# Patient Record
Sex: Female | Born: 1935 | Race: White | Hispanic: No | Smoking: Former smoker
Health system: Southern US, Community
[De-identification: ages and names within clinical notes are randomized; demographics above are authoritative.]

## PROBLEM LIST (undated history)

## (undated) DIAGNOSIS — K219 Gastro-esophageal reflux disease without esophagitis: Secondary | ICD-10-CM

## (undated) DIAGNOSIS — I10 Essential (primary) hypertension: Secondary | ICD-10-CM

## (undated) DIAGNOSIS — K589 Irritable bowel syndrome without diarrhea: Secondary | ICD-10-CM

## (undated) DIAGNOSIS — G4762 Sleep related leg cramps: Secondary | ICD-10-CM

## (undated) DIAGNOSIS — F329 Major depressive disorder, single episode, unspecified: Secondary | ICD-10-CM

## (undated) DIAGNOSIS — M47812 Spondylosis without myelopathy or radiculopathy, cervical region: Secondary | ICD-10-CM

## (undated) DIAGNOSIS — D239 Other benign neoplasm of skin, unspecified: Secondary | ICD-10-CM

## (undated) DIAGNOSIS — H3530 Unspecified macular degeneration: Secondary | ICD-10-CM

## (undated) DIAGNOSIS — R42 Dizziness and giddiness: Secondary | ICD-10-CM

## (undated) DIAGNOSIS — E538 Deficiency of other specified B group vitamins: Secondary | ICD-10-CM

## (undated) DIAGNOSIS — E785 Hyperlipidemia, unspecified: Secondary | ICD-10-CM

## (undated) DIAGNOSIS — F32A Depression, unspecified: Secondary | ICD-10-CM

## (undated) DIAGNOSIS — N904 Leukoplakia of vulva: Secondary | ICD-10-CM

## (undated) DIAGNOSIS — Z85828 Personal history of other malignant neoplasm of skin: Secondary | ICD-10-CM

## (undated) HISTORY — PX: OTHER SURGICAL HISTORY: SHX169

## (undated) HISTORY — DX: Spondylosis without myelopathy or radiculopathy, cervical region: M47.812

## (undated) HISTORY — DX: Personal history of other malignant neoplasm of skin: Z85.828

## (undated) HISTORY — PX: CERVICAL SPINE SURGERY: SHX589

## (undated) HISTORY — PX: TONSILLECTOMY: SUR1361

## (undated) HISTORY — DX: Major depressive disorder, single episode, unspecified: F32.9

## (undated) HISTORY — DX: Sleep related leg cramps: G47.62

## (undated) HISTORY — DX: Irritable bowel syndrome, unspecified: K58.9

## (undated) HISTORY — PX: CATARACT EXTRACTION: SUR2

## (undated) HISTORY — DX: Dizziness and giddiness: R42

## (undated) HISTORY — DX: Gastro-esophageal reflux disease without esophagitis: K21.9

## (undated) HISTORY — DX: Hyperlipidemia, unspecified: E78.5

## (undated) HISTORY — DX: Deficiency of other specified B group vitamins: E53.8

## (undated) HISTORY — PX: APPENDECTOMY: SHX54

## (undated) HISTORY — DX: Unspecified macular degeneration: H35.30

## (undated) HISTORY — DX: Depression, unspecified: F32.A

## (undated) HISTORY — DX: Essential (primary) hypertension: I10

---

## 1998-05-10 ENCOUNTER — Other Ambulatory Visit: Admission: RE | Admit: 1998-05-10 | Discharge: 1998-05-10 | Payer: Self-pay | Admitting: *Deleted

## 1998-06-02 ENCOUNTER — Ambulatory Visit (HOSPITAL_COMMUNITY): Admission: RE | Admit: 1998-06-02 | Discharge: 1998-06-02 | Payer: Self-pay | Admitting: Gastroenterology

## 1999-09-05 ENCOUNTER — Other Ambulatory Visit: Admission: RE | Admit: 1999-09-05 | Discharge: 1999-09-05 | Payer: Self-pay | Admitting: *Deleted

## 1999-11-30 ENCOUNTER — Other Ambulatory Visit: Admission: RE | Admit: 1999-11-30 | Discharge: 1999-11-30 | Payer: Self-pay | Admitting: *Deleted

## 2000-10-17 ENCOUNTER — Encounter: Payer: Self-pay | Admitting: Surgery

## 2000-10-17 ENCOUNTER — Observation Stay (HOSPITAL_COMMUNITY): Admission: RE | Admit: 2000-10-17 | Discharge: 2000-10-18 | Payer: Self-pay | Admitting: Surgery

## 2000-10-17 ENCOUNTER — Encounter (INDEPENDENT_AMBULATORY_CARE_PROVIDER_SITE_OTHER): Payer: Self-pay | Admitting: Specialist

## 2000-11-26 ENCOUNTER — Other Ambulatory Visit: Admission: RE | Admit: 2000-11-26 | Discharge: 2000-11-26 | Payer: Self-pay | Admitting: *Deleted

## 2002-03-27 ENCOUNTER — Other Ambulatory Visit: Admission: RE | Admit: 2002-03-27 | Discharge: 2002-03-27 | Payer: Self-pay | Admitting: *Deleted

## 2003-10-23 ENCOUNTER — Other Ambulatory Visit: Admission: RE | Admit: 2003-10-23 | Discharge: 2003-10-23 | Payer: Self-pay | Admitting: *Deleted

## 2004-03-23 ENCOUNTER — Encounter: Admission: RE | Admit: 2004-03-23 | Discharge: 2004-03-23 | Payer: Self-pay | Admitting: Sports Medicine

## 2005-02-02 ENCOUNTER — Other Ambulatory Visit: Admission: RE | Admit: 2005-02-02 | Discharge: 2005-02-02 | Payer: Self-pay | Admitting: Obstetrics and Gynecology

## 2006-08-07 ENCOUNTER — Encounter: Admission: RE | Admit: 2006-08-07 | Discharge: 2006-08-30 | Payer: Self-pay | Admitting: Family Medicine

## 2009-05-06 ENCOUNTER — Encounter: Admission: RE | Admit: 2009-05-06 | Discharge: 2009-05-06 | Payer: Self-pay | Admitting: Family Medicine

## 2009-10-25 ENCOUNTER — Encounter: Admission: RE | Admit: 2009-10-25 | Discharge: 2009-11-24 | Payer: Self-pay | Admitting: Orthopedic Surgery

## 2010-03-30 ENCOUNTER — Other Ambulatory Visit: Payer: Self-pay | Admitting: Gastroenterology

## 2010-07-22 NOTE — Op Note (Signed)
[facility]  Patient:    Tiffany Mejia, Tiffany Mejia                     MRN: [id number] Proc. Date: 10/17/00 Adm. Date:  [id number] Attending:  Bonnetta Barry CC:         Doreatha Lew, M.D. of [facility]  Payton Doughty, M.D. at [facility]   Operative Report  PREOPERATIVE DIAGNOSIS:  Chronic cholecystitis, cholelithiasis.  POSTOPERATIVE DIAGNOSIS:  Chronic cholecystitis, cholelithiasis.  PROCEDURE:  Laparoscopic cholecystectomy with intraoperative cholangiography.  SURGEON:  Velora Heckler, M.D.  ASSISTANT:  Sheppard Plumber. Earlene Plater, M.D.  ANESTHESIA:  General per Dr. Almeta Monas.  ESTIMATED BLOOD LOSS:  Minimal.  PREPARATION:  Betadine.  COMPLICATIONS:  None.  INDICATIONS:  The patient is a 74 year old white female who presents at the request of Dr. Doreatha Lew from [facility] with symptomatic cholelithiasis.  The patient has had discrete episodes of biliary colic dating from February 5621.  These are associated with right upper quadrant abdominal pain radiating to the back associated with nausea.  An ultrasound performed August 14, 2000, at [facility] demonstrates numerous small gallstones.  The patient now comes to surgery for cholecystectomy.  DESCRIPTION OF PROCEDURE:  The procedure was done in OR #1 at the [facility].  The patient was brought to the operating room and placed in the supine position on the operating room table.  Following the administration of general anesthesia, the patient was prepped and draped in the usual strict aseptic fashion.  After ascertaining that an adequate level of anesthesia had been obtained, an infraumbilical incision was made with a #15 blade.  Dissection was carried down through the subcutaneous tissues.  The fascia was incised in the midline.  The peritoneal cavity was entered cautiously.  An 0 Vicryl pursestring suture was placed in  the fascia.  A Hasson cannula was introduced under direct vision and secured with a pursestring suture.  The abdomen was insufflated with carbon dioxide.  The laparoscope was introduced under direct vision and the abdomen explored.  There are adhesions in the right lower quadrant consistent with past history of appendectomy. There are omental adhesions to the gallbladder.  Operative ports are placed along the right costal margin in the midline, midclavicular line, and anterior axillary line.  Fundus of the gallbladder was grasped and retracted cephalad. Omental adhesions are dissected off the under surface of the gallbladder. Dissection was carried down to the neck of the gallbladder.  The peritoneum was incised at the neck of the gallbladder and the cystic duct is dissected out along its length.  A clip was placed at the neck of the gallbladder.  The cystic duct was incised with the scissors.  There was clear bile emanating from the opening.  A Cook cholangiography catheter was introduced through a right upper quadrant stab wound.  It was inserted without difficulty into the cystic duct and secured with a Ligaclip.  Using C-arm fluoroscopy, Real-Time cholangiography is performed.  There is rapid filling of the biliary tree.  There is reflux of contrast into the right and left ductal systems.  There is free flow of contrast into the duodenum without filling defect or evidence of obstruction.  The catheter was withdrawn and the clip was removed.  The cystic duct was then triply clipped and divided.  The cystic artery is dissected out along its length, doubly clipped, and divided.  The posterior branch of the cystic artery is  dissected out along its length, doubly clipped, and divided.  The gallbladder was excised from the gallbladder bed using the spatula electrocautery for hemostasis.  The gallbladder was completely excised.  I then extracted through the umbilical port.  It contains  multiple gallstones.  An 0 Vicryl pursestring suture is tied securely.  The right upper quadrant is copiously irrigated with warm saline which was evaluated.  Good hemostasis was noted.  Ports were removed under direct vision and pneumoperitoneum was released.  A local anesthetic is injected at the port sites.  All four wounds were closed with interrupted 4-0 Vicryl subcuticular sutures.  The wounds were washed and dried, and Benzoin and Steri-Strips are applied.  Sterile gauze dressings are applied.  The patient was awakened from anesthesia and brought to the recovery room in stable condition.  The patient tolerated the procedure well. DD:  10/17/00 TD:  10/17/00 Job: [id number]

## 2010-09-21 ENCOUNTER — Ambulatory Visit
Admission: RE | Admit: 2010-09-21 | Discharge: 2010-09-21 | Disposition: A | Discharge status: Home / Self Care | Payer: Medicare Other | Source: Ambulatory Visit | Attending: Family Medicine | Admitting: Family Medicine

## 2010-09-21 ENCOUNTER — Other Ambulatory Visit: Payer: Self-pay | Admitting: Family Medicine

## 2010-09-21 DIAGNOSIS — M549 Dorsalgia, unspecified: Secondary | ICD-10-CM

## 2011-08-28 ENCOUNTER — Ambulatory Visit: Payer: Medicare Other | Attending: Family Medicine | Admitting: Physical Therapy

## 2011-08-28 DIAGNOSIS — IMO0001 Reserved for inherently not codable concepts without codable children: Secondary | ICD-10-CM | POA: Insufficient documentation

## 2011-08-28 DIAGNOSIS — R269 Unspecified abnormalities of gait and mobility: Secondary | ICD-10-CM | POA: Insufficient documentation

## 2011-08-28 DIAGNOSIS — H8110 Benign paroxysmal vertigo, unspecified ear: Secondary | ICD-10-CM | POA: Insufficient documentation

## 2011-09-01 ENCOUNTER — Ambulatory Visit: Payer: Medicare Other | Admitting: Physical Therapy

## 2011-09-05 ENCOUNTER — Encounter: Payer: Medicare Other | Admitting: Rehabilitative and Restorative Service Providers"

## 2011-09-08 ENCOUNTER — Encounter: Payer: Medicare Other | Admitting: Rehabilitative and Restorative Service Providers"

## 2011-09-19 ENCOUNTER — Ambulatory Visit: Payer: Medicare Other | Attending: Family Medicine | Admitting: Physical Therapy

## 2011-09-19 DIAGNOSIS — R269 Unspecified abnormalities of gait and mobility: Secondary | ICD-10-CM | POA: Insufficient documentation

## 2011-09-19 DIAGNOSIS — H8110 Benign paroxysmal vertigo, unspecified ear: Secondary | ICD-10-CM | POA: Insufficient documentation

## 2011-09-19 DIAGNOSIS — IMO0001 Reserved for inherently not codable concepts without codable children: Secondary | ICD-10-CM | POA: Insufficient documentation

## 2012-01-31 ENCOUNTER — Ambulatory Visit: Payer: Medicare Other | Attending: Family Medicine | Admitting: *Deleted

## 2012-01-31 DIAGNOSIS — H8110 Benign paroxysmal vertigo, unspecified ear: Secondary | ICD-10-CM | POA: Insufficient documentation

## 2012-01-31 DIAGNOSIS — R269 Unspecified abnormalities of gait and mobility: Secondary | ICD-10-CM | POA: Insufficient documentation

## 2012-01-31 DIAGNOSIS — IMO0001 Reserved for inherently not codable concepts without codable children: Secondary | ICD-10-CM | POA: Insufficient documentation

## 2012-02-06 ENCOUNTER — Ambulatory Visit: Payer: Medicare Other | Attending: Family Medicine | Admitting: *Deleted

## 2012-02-06 DIAGNOSIS — H8110 Benign paroxysmal vertigo, unspecified ear: Secondary | ICD-10-CM | POA: Insufficient documentation

## 2012-02-06 DIAGNOSIS — R269 Unspecified abnormalities of gait and mobility: Secondary | ICD-10-CM | POA: Insufficient documentation

## 2012-02-06 DIAGNOSIS — IMO0001 Reserved for inherently not codable concepts without codable children: Secondary | ICD-10-CM | POA: Insufficient documentation

## 2012-02-09 ENCOUNTER — Ambulatory Visit: Payer: Medicare Other | Admitting: *Deleted

## 2012-02-13 ENCOUNTER — Ambulatory Visit: Payer: Medicare Other | Admitting: Physical Therapy

## 2012-02-15 ENCOUNTER — Ambulatory Visit: Payer: Medicare Other | Admitting: Physical Therapy

## 2012-02-20 ENCOUNTER — Ambulatory Visit: Payer: Medicare Other | Admitting: *Deleted

## 2012-02-23 ENCOUNTER — Ambulatory Visit: Payer: Medicare Other | Admitting: *Deleted

## 2012-03-04 ENCOUNTER — Encounter: Payer: Medicare Other | Admitting: Physical Therapy

## 2012-03-07 ENCOUNTER — Encounter: Payer: Medicare Other | Admitting: Physical Therapy

## 2012-04-02 ENCOUNTER — Ambulatory Visit: Payer: Medicare Other | Attending: Physical Medicine and Rehabilitation

## 2012-04-02 DIAGNOSIS — M545 Low back pain, unspecified: Secondary | ICD-10-CM | POA: Insufficient documentation

## 2012-04-02 DIAGNOSIS — R5381 Other malaise: Secondary | ICD-10-CM | POA: Insufficient documentation

## 2012-04-02 DIAGNOSIS — IMO0001 Reserved for inherently not codable concepts without codable children: Secondary | ICD-10-CM | POA: Insufficient documentation

## 2012-04-09 ENCOUNTER — Ambulatory Visit: Payer: Medicare Other | Attending: Physical Medicine and Rehabilitation

## 2012-04-09 DIAGNOSIS — R5381 Other malaise: Secondary | ICD-10-CM | POA: Insufficient documentation

## 2012-04-09 DIAGNOSIS — M545 Low back pain, unspecified: Secondary | ICD-10-CM | POA: Insufficient documentation

## 2012-04-09 DIAGNOSIS — IMO0001 Reserved for inherently not codable concepts without codable children: Secondary | ICD-10-CM | POA: Insufficient documentation

## 2012-04-16 ENCOUNTER — Ambulatory Visit: Payer: Medicare Other

## 2012-04-23 ENCOUNTER — Ambulatory Visit: Payer: Medicare Other

## 2013-04-02 ENCOUNTER — Ambulatory Visit (INDEPENDENT_AMBULATORY_CARE_PROVIDER_SITE_OTHER): Payer: Medicare Other | Admitting: Neurology

## 2013-04-02 ENCOUNTER — Encounter (INDEPENDENT_AMBULATORY_CARE_PROVIDER_SITE_OTHER): Payer: Self-pay

## 2013-04-02 ENCOUNTER — Encounter: Payer: Self-pay | Admitting: Neurology

## 2013-04-02 VITALS — BP 126/67 | HR 72 | Ht 66.34 in | Wt 148.0 lb

## 2013-04-02 DIAGNOSIS — R209 Unspecified disturbances of skin sensation: Secondary | ICD-10-CM

## 2013-04-02 DIAGNOSIS — M79609 Pain in unspecified limb: Secondary | ICD-10-CM | POA: Insufficient documentation

## 2013-04-02 NOTE — Progress Notes (Addendum)
Reason for visit: Neck pain, leg pain  Tiffany Mejia is a 78 y.o. female  History of present illness:  Ms. Vilar is a 78 year old right-handed white female with a history of numbness in the feet. The patient was last seen through this office on 01/04/2011. The patient continues to have some numbness in both feet. Prior nerve conduction studies were unremarkable. The patient indicates that she has had some pain and cramping in the calf of the left leg. The patient has some numbness that goes up the left leg slightly. The patient had symptoms that have gone on for about one year. The patient also notes some right neck discomfort and occasional aching pains into the right triceps muscle. The patient has undergone some neuromuscular therapy with some improvement. The patient denies any weakness of the extremities. The patient will occasionally have some right hand numbness. The patient does have some weakness with grip involving the right hand. The patient denies any balance issues or falls. The patient has some chronic bladder incontinence. The patient has been seen previously by Dr. Nelva Bush. The patient comes back to the office today for an evaluation.  Past Medical History  Diagnosis Date  . Hypertension   . HLD (hyperlipidemia)   . Macular degeneration   . Vitamin B12 deficiency   . Depression   . IBS (irritable bowel syndrome)   . GERD (gastroesophageal reflux disease)   . Nocturnal leg cramps   . Vertigo   . History of basal cell carcinoma   . Cervical spondylosis without myelopathy     Past Surgical History  Procedure Laterality Date  . Cataract extraction Right   . Cervical spine surgery    . Gallbladder resection    . Tonsillectomy    . Appendectomy      Family History  Problem Relation Age of Onset  . Heart disease Father   . Stroke Sister   . Neuropathy Sister   . Hypertension Brother   . Stroke Brother     Social history:  reports that she has quit smoking. She  has never used smokeless tobacco. She reports that she drinks alcohol. She reports that she does not use illicit drugs.  Medications:  No current outpatient prescriptions on file prior to visit.   No current facility-administered medications on file prior to visit.      Allergies  Allergen Reactions  . Nexium [Esomeprazole Magnesium] Rash    ROS:  Out of a complete 14 system review of symptoms, the patient complains only of the following symptoms, and all other reviewed systems are negative.  Neck pain, neck stiffness, ringing in the left ear Visual loss, left eye Leg swelling Frequent waking, leg spasms Incontinence of bladder, frequency of urination  Blood pressure 126/67, pulse 72, height 5' 6.34" (1.685 m), weight 148 lb (67.132 kg).  Physical Exam  General: The patient is alert and cooperative at the time of the examination.  Eyes: Pupils are equal, round, and reactive to light. Discs are flat bilaterally.  Neck: The neck is supple, no carotid bruits are noted.  Respiratory: The respiratory examination is clear.  Cardiovascular: The cardiovascular examination reveals a regular rate and rhythm, no obvious murmurs or rubs are noted.  Neuromuscular: The patient lacks about 20 lateral rotation of the cervical spine bilaterally. The patient is able to flex the low back to about 90.  Skin: Extremities are without significant edema.  Neurologic Exam  Mental status: The patient is alert and oriented  x 3 at the time of the examination. The patient has apparent normal recent and remote memory, with an apparently normal attention span and concentration ability.  Cranial nerves: Facial symmetry is present. There is good sensation of the face to pinprick and soft touch bilaterally. The strength of the facial muscles and the muscles to head turning and shoulder shrug are normal bilaterally. Speech is well enunciated, no aphasia or dysarthria is noted. Extraocular movements are  full. Visual fields are full. The tongue is midline, and the patient has symmetric elevation of the soft palate. No obvious hearing deficits are noted.  Motor: The motor testing reveals 5 over 5 strength of all 4 extremities. Good symmetric motor tone is noted throughout.  Sensory: Sensory testing is intact to pinprick, soft touch, vibration sensation, and position sense on all 4 extremities, with exception that there is a decrease in position sense of the right foot, and a stocking pattern pinprick sensory deficit two thirds the way up the legs bilaterally. No evidence of extinction is noted.  Coordination: Cerebellar testing reveals good finger-nose-finger and heel-to-shin bilaterally.  Gait and station: Gait is normal. Tandem gait is normal. Romberg is negative. No drift is seen.  Reflexes: Deep tendon reflexes are symmetric and normal bilaterally. Toes are downgoing bilaterally.    Lumbar MRI 03/11/2004:  IMPRESSION:  1. Mild multifactorial canal stenosis at L4-5. There is edema in the right L-4 pedicle and transverse process with prominent bilateral facet degeneration.  2. Mild multifactorial canal stenosis at L3-4 and to a lesser degree at L2-3.  3. Small bilateral T2- hyperintense foci are seen in both kidneys, probably related to cysts, but incompletely characterized. Ultrasound followup may be helpful to further evaluate.    Assessment/Plan:  One. Left leg discomfort, spasms  2. Cervical spondylosis, right neck and shoulder discomfort  3. Bilateral foot numbness  The patient be set up for nerve conduction studies of both legs and the right arm. EMG evaluation will be done on the right arm and left leg. Depending upon the results of this evaluation, the patient may be sent for further blood work. The patient may require further therapy with epidural steroid injections of the neck or back.  Jill Alexanders MD 04/02/2013 9:41 PM  [facility] [address] [place], [place] [address]  Phone [phone] Fax [phone]

## 2013-04-11 ENCOUNTER — Ambulatory Visit (INDEPENDENT_AMBULATORY_CARE_PROVIDER_SITE_OTHER): Payer: Medicare Other | Admitting: Neurology

## 2013-04-11 ENCOUNTER — Encounter (INDEPENDENT_AMBULATORY_CARE_PROVIDER_SITE_OTHER): Payer: Self-pay | Admitting: Radiology

## 2013-04-11 DIAGNOSIS — R209 Unspecified disturbances of skin sensation: Secondary | ICD-10-CM

## 2013-04-11 DIAGNOSIS — G5600 Carpal tunnel syndrome, unspecified upper limb: Secondary | ICD-10-CM

## 2013-04-11 DIAGNOSIS — Z0289 Encounter for other administrative examinations: Secondary | ICD-10-CM

## 2013-04-11 DIAGNOSIS — M79609 Pain in unspecified limb: Secondary | ICD-10-CM

## 2013-04-11 DIAGNOSIS — G544 Lumbosacral root disorders, not elsewhere classified: Secondary | ICD-10-CM

## 2013-04-11 MED ORDER — DULOXETINE HCL 30 MG PO CPEP: ORAL_CAPSULE | ORAL | Status: DC

## 2013-04-11 NOTE — Procedures (Signed)
HISTORY:  Tiffany Mejia is a 78 year old patient with a history of paresthesias in the feet, and some cramps in the legs, left greater than right. The patient has some achy sensations in the triceps area on the right arm. The patient being evaluated for a possible peripheral neuropathy or a radiculopathy.  NERVE CONDUCTION STUDIES:  Nerve conduction studies were performed on the right upper extremity. The distal motor latency for the right median nerve was prolonged, with a normal motor amplitude. The distal motor latency and motor amplitudes for the right ulnar nerve was normal. The F wave latencies and nerve conduction velocities for the right median and ulnar nerves were normal. The sensory latencies for the right median and ulnar nerves were normal, with slight prolongation of the sensory latency for the right radial nerve.  Nerve conduction studies were performed on both lower extremities. The distal motor latencies and motor amplitudes for the peroneal and posterior tibial nerves were normal bilaterally. Slight slowing was seen for the left peroneal nerve, normal on the right peroneal nerve. The nerve conduction velocities for the posterior tibial nerves were normal bilaterally. The F wave latencies for the peroneal and posterior tibial nerves were prolonged bilaterally. The sensory latencies for the peroneal nerves were normal bilaterally.  EMG STUDIES:  EMG study was performed on the right upper extremity:  The first dorsal interosseous muscle reveals 2 to 4 K units with full recruitment. No fibrillations or positive waves were noted. The abductor pollicis brevis muscle reveals 2 to 4 K units with full recruitment. No fibrillations or positive waves were noted. The extensor indicis proprius muscle reveals 1 to 3 K units with full recruitment. No fibrillations or positive waves were noted. The pronator teres muscle reveals 2 to 3 K units with full recruitment. No fibrillations or  positive waves were noted. The biceps muscle reveals 1 to 2 K units with full recruitment. No fibrillations or positive waves were noted. The triceps muscle reveals 2 to 4 K units with full recruitment. No fibrillations or positive waves were noted. The anterior deltoid muscle reveals 2 to 3 K units with full recruitment. No fibrillations or positive waves were noted. The cervical paraspinal muscles were tested at 2 levels. No abnormalities of insertional activity were seen at either level tested. There was good relaxation.  EMG study was performed on the left lower extremity:  The tibialis anterior muscle reveals 2 to 5K motor units with decreased recruitment. No fibrillations or positive waves were seen. The peroneus tertius muscle reveals 2 to 4K motor units with decreased recruitment. No fibrillations or positive waves were seen. The medial gastrocnemius muscle reveals 1 to 3K motor units with full recruitment. No fibrillations or positive waves were seen. The vastus lateralis muscle reveals 2 to 4K motor units with full recruitment. No fibrillations or positive waves were seen. The iliopsoas muscle reveals 2 to 4K motor units with full recruitment. No fibrillations or positive waves were seen. The biceps femoris muscle (long head) reveals 2 to 5K motor units with mildly decreased recruitment. No fibrillations or positive waves were seen. The lumbosacral paraspinal muscles were tested at 3 levels, and revealed no abnormalities of insertional activity at all 3 levels tested. There was good relaxation.   IMPRESSION:  Nerve conduction studies done on the right arm and both lower extremities does not show clear evidence of a peripheral neuropathy. There appears to be a borderline right carpal tunnel syndrome. EMG evaluation of the left lower extremity shows  findings that are consistent with a mild, chronic, stable L5 radiculopathy. EMG evaluation of the right upper extremity was unremarkable,  without evidence of an overlying cervical radiculopathy.  Jill Alexanders MD 04/11/2013 2:00 PM  [facility] [address] [place],  [address]  Phone [phone] Fax [phone]

## 2013-04-11 NOTE — Progress Notes (Signed)
Tiffany Mejia is a 78 year old patient with a history of achy sensations in the triceps muscle on the right arm, and some cramping sensations in both legs, left greater than right. The patient has tingling sensations in both feet, and the patient comes in for EMG and nerve conduction study. Nerve conductions do not show clear evidence of a peripheral neuropathy. The patient is a borderline right carpal tunnel syndrome. EMG of the right arm is unremarkable, EMG of the left leg is notable for a mild chronic stable L5 radiculopathy.  The patient will be given a wrist splint for the right wrist to try for 2 months to see if this improves her symptoms. The patient will be given a trial on Cymbalta to see that this helps some of the paresthesias and muscle cramps in the legs. In the past, the patient has not tolerated gabapentin, and she is worried about weight gain on Lyrica.  The patient will followup in 3-4 months. A prescription was given for the wrist splint.

## 2014-06-17 ENCOUNTER — Ambulatory Visit: Payer: PPO | Attending: Ophthalmology | Admitting: Occupational Therapy

## 2014-06-17 DIAGNOSIS — H53453 Other localized visual field defect, bilateral: Secondary | ICD-10-CM | POA: Diagnosis present

## 2014-06-17 DIAGNOSIS — H53419 Scotoma involving central area, unspecified eye: Secondary | ICD-10-CM

## 2014-06-18 NOTE — Therapy (Signed)
[facility] [address] [place], [place], [address] Phone: [phone]   Fax:  [phone]  Occupational Therapy Evaluation  Patient Details  Name: Tiffany Mejia MRN: [id number] Date of Birth: 03-Dec-1935 Referring Provider:  Gerarda Fraction, MD  Encounter Date: 06/17/2014      OT End of Session - 06/17/14 1526    Visit Number 1   Number of Visits 4   Authorization Type [employer] MCR   OT Start Time 1411   OT Stop Time 1515   OT Time Calculation (min) 64 min   Activity Tolerance Patient tolerated treatment well   Behavior During Therapy [facility] for tasks assessed/performed      Past Medical History  Diagnosis Date  . Hypertension   . HLD (hyperlipidemia)   . Macular degeneration   . Vitamin B12 deficiency   . Depression   . IBS (irritable bowel syndrome)   . GERD (gastroesophageal reflux disease)   . Nocturnal leg cramps   . Vertigo   . History of basal cell carcinoma   . Cervical spondylosis without myelopathy     Past Surgical History  Procedure Laterality Date  . Cataract extraction Right   . Cervical spine surgery    . Gallbladder resection    . Tonsillectomy    . Appendectomy      There were no vitals filed for this visit.  Visit Diagnosis:  Scotoma involving central area, unspecified laterality      Subjective Assessment - 06/17/14 1416    Subjective  Pt reports she would like to be able to read easier   Pertinent History Pt with macular degeneration, history of cataract surgery with lens implant   Patient Stated Goals Pt reports she is interested in a better magnifier.   Currently in Pain? Yes   Pain Location Leg   Pain Orientation Right;Left   Pain Type Neuropathic pain   Pain Onset More than a month ago   Aggravating Factors  unknown   Pain Relieving Factors repositioning   Multiple Pain Sites No           [facility] OT Assessment - 06/18/14 0001    Assessment   Diagnosis macular  degeneration, cataract surgery s/p lens implant   Onset Date 05/14/14  MD referral   Assessment Pt presents with visual deficits and can benefit from skilled occupational therapy for education regarding adapted strategies/ equipment to compensate for these deficits.   Prior Therapy PT for balance   Precautions   Precautions Fall;Other (comment)  visual impairment   Home  Environment   Family/patient expects to be discharged to: Private residence   Home Layout Two level   Additional Comments Pt plans to move to a 1 level apartment   Lives With Alone   Prior Function   Level of Hand with basic ADLs;Independent with homemaking with ambulation   ADL   ADL comments modified independent with all basic ADLS  Pt drives familiar routes.   Mobility   Mobility Status Independent   Vision - History   Baseline Vision Other (comment)  wears glasses PRN   Visual History Cataracts   Additional Comments Pt wants to be able to read better.   Vision Assessment   Vision Assessment Vision tested  for reading acuity   Per MD/OD Report OD 20/60+2, CF at 1"   Reading Acuity 20/63   Patient has diffculty with activities due to visual impairment Adjusting stove/washing machine dials;Writing checks;Balancing checkbook;Reading bills  Education  performed re: use of hi marks check writing guide   Comment Pt reports increased difficulty using her magnifier now.Pt drives familiar routes  Pt has a CCTV that belonged to her husband.   Cognition   Mini Mental State Exam  29/30 WNLS                  OT Treatments/Exercises (OP) - 06/18/14 0001    Visual/Perceptual Exercises   Other Exercises Pt was educated regarding 3X stand and handheld magnifier use. She demonstrates ability to read continuous text.  Pt also demonstrated ability to read using CCTV                OT Education - 06/18/14 1035    Education provided Yes   Education Details Use of 3x stand,handheld  magnifiers, eccentric viewing, use of CCTV, and line guides for check writing   Person(s) Educated Patient   Methods Explanation;Demonstration;Verbal cues  catalog provided   Comprehension Verbalized understanding;Returned demonstration  Pt returned demonstration of magnifier and CCTV use. she verbalizes understanding of eccentric viewing and line guide use.             OT Long Term Goals - 06/18/14 1034    OT LONG TERM GOAL #1   Title No goals were set as pt was seen for eval and tx day of evaluation.               Plan - 06/18/14 1029    Clinical Impression Statement Pt with macular degeneration, and history of cataract surgery with lens implant presents with low vision. Pt can benefit from skilled OT for evaluation and edcation regarding adpted strategies for low vision.   Pt will benefit from skilled therapeutic intervention in order to improve on the following deficits (Retired) Impaired vision/preception   Rehab Potential Good   Clinical Impairments Affecting Rehab Potential visual deficits   OT Frequency One time visit  evaluation and 1 tx   OT Treatment/Interventions Self-care/ADL training;DME and/or AE instruction;Patient/family education;Visual/perceptual remediation/compensation   Plan Pt was seen for evaluation and treatment day of evaluation. Pt demonstrates good understanding of magnifier use and does not need additional visits.   OT Home Exercise Plan Pt was educated regarding use of 3x stand and hand held magnifiers, CCTV, check line guides and eccentric viewing   Recommended Other Services Pt was provided with an [employer] catalog for purchase of AE PRN (Pt was made aware that [facility] is not affliliated with [facility])   Consulted and Agree with Plan of Care Patient          G-Codes - Jul 05, 2014 1600    Functional Assessment Tool Used clinical observations   Functional Limitation Self care   Self Care Current Status (Y7062)  At least 1 percent but less than 20 percent impaired, limited or restricted   Self Care Goal Status (B7628) At least 1 percent but less than 20 percent impaired, limited or restricted   Self Care Discharge Status [id number]) At least 1 percent but less than 20 percent impaired, limited or restricted      Problem List Patient Active Problem List   Diagnosis Date Noted  . Disturbance of skin sensation 04/02/2013  . Pain in limb 04/02/2013    Saniyyah Elster 06/18/2014, 10:56 AM Theone Murdoch, OTR/L [phone] Phone: [phone] 10:56 AM 04/14/2016[facility] [address] [place], [place], [address] Phone: [phone]   Fax:  [phone]

## 2014-07-23 ENCOUNTER — Other Ambulatory Visit: Payer: Self-pay | Admitting: Obstetrics & Gynecology

## 2014-07-23 DIAGNOSIS — I8390 Asymptomatic varicose veins of unspecified lower extremity: Secondary | ICD-10-CM

## 2014-07-30 ENCOUNTER — Ambulatory Visit
Admission: RE | Admit: 2014-07-30 | Discharge: 2014-07-30 | Disposition: A | Discharge status: Home / Self Care | Payer: PPO | Source: Ambulatory Visit | Attending: Obstetrics & Gynecology | Admitting: Obstetrics & Gynecology

## 2014-07-30 DIAGNOSIS — I8390 Asymptomatic varicose veins of unspecified lower extremity: Secondary | ICD-10-CM

## 2014-07-30 HISTORY — DX: Other benign neoplasm of skin, unspecified: D23.9

## 2014-07-30 HISTORY — DX: Leukoplakia of vulva: N90.4

## 2014-07-30 NOTE — Consult Note (Signed)
Chief Complaint: Chief Complaint  Patient presents with  . Advice Only    ? vulva cyst    Referring Physician(s): Ozan,Jennifer  History of Present Illness: Tiffany Mejia is a 79 y.o. female complaining of a painful nodule on her left vulvar region. The lesion is irritated by overlying clothing. Previous dermatologic assessment had reportedly diagnosed as a cyst. No pelvic pain, pelvic pressure, bloating, varicose veins.  Past Medical History  Diagnosis Date  . Hypertension   . HLD (hyperlipidemia)   . Macular degeneration   . Vitamin B12 deficiency   . Depression   . IBS (irritable bowel syndrome)   . GERD (gastroesophageal reflux disease)   . Nocturnal leg cramps   . Vertigo   . History of basal cell carcinoma   . Cervical spondylosis without myelopathy   . Lichen sclerosus of female genitalia   . Angiokeratoma     Past Surgical History  Procedure Laterality Date  . Cataract extraction Right   . Cervical spine surgery    . Gallbladder resection    . Tonsillectomy    . Appendectomy      Allergies: Nexium  Medications: Prior to Admission medications   Medication Sig Start Date End Date Taking? Authorizing Provider  clobetasol ointment (TEMOVATE) 5.63 % Apply 1 application topically 2 (two) times daily.   Yes Historical Provider, MD  lisinopril (PRINIVIL,ZESTRIL) 2.5 MG tablet Take 2.5 mg by mouth daily.   Yes Historical Provider, MD  Melatonin 3 MG CAPS Take 1 capsule by mouth.   Yes Historical Provider, MD  Multiple Vitamins-Minerals (VITEYES AREDS FORMULA/LUTEIN) CAPS Take 1 capsule by mouth daily.   Yes Historical Provider, MD  PREMARIN vaginal cream Place 8.756 Applicatorfuls vaginally at bedtime as needed. 03/21/13  Yes Historical Provider, MD  vitamin B-12 (CYANOCOBALAMIN) 1000 MCG tablet Take 1,000 mcg by mouth daily.   Yes Historical Provider, MD  Vitamin D, Ergocalciferol, (DRISDOL) 50000 UNITS CAPS capsule Take 50,000 Units by mouth every 7  (seven) days.   Yes Historical Provider, MD  zolpidem (AMBIEN) 10 MG tablet Take 10 mg by mouth at bedtime as needed for sleep (.5 as needed).   Yes Historical Provider, MD  DULoxetine (CYMBALTA) 30 MG capsule Take 30 mg by mouth daily.    Historical Provider, MD  DULoxetine (CYMBALTA) 30 MG capsule One tablet daily for 2 weeks, then take 1 tablet twice daily Patient not taking: Reported on 07/30/2014 04/11/13   Kathrynn Ducking, MD  pravastatin (PRAVACHOL) 20 MG tablet Take 20 mg by mouth daily. 03/28/13   Historical Provider, MD     Family History  Problem Relation Age of Onset  . Heart disease Father   . Stroke Sister   . Neuropathy Sister   . Hypertension Brother   . Stroke Brother     History   Social History  . Marital Status: Widowed    Spouse Name: N/A  . Number of Children: 5  . Years of Education: 12th   Occupational History  . retired    Social History Main Topics  . Smoking status: Former Research scientist (life sciences)  . Smokeless tobacco: Never Used     Comment: quit Feb 1997  . Alcohol Use: Yes     Comment: occassional 2 glasses of wine nightly  . Drug Use: No  . Sexual Activity: Not on file   Other Topics Concern  . Not on file   Social History Narrative    ECOG Status: 1 - Symptomatic but  completely ambulatory  Review of Systems: A 12 point ROS discussed and pertinent positives are indicated in the HPI above.  All other systems are negative.  Review of Systems  Vital Signs: BP 128/66 mmHg  Pulse 66  Temp(Src) 97.8 F (36.6 C) (Oral)  Resp 14  Ht 5\' 6"  (1.676 m)  Wt 153 lb (69.4 kg)  BMI 24.71 kg/m2  SpO2 97%  Physical Exam  Genitourinary:    Pelvic exam was performed with patient supine.  Firm cutaneous nodule which could not be reduced or compressed, nontender, no erythema. Small 1 mm black head at the inferior extent. No regional varicose veins or other skin discoloration or trophic changes    Mallampati Score:     Imaging: No results  found.  Labs:  CBC: No results for input(s): WBC, HGB, HCT, PLT in the last 8760 hours.  COAGS: No results for input(s): INR, APTT in the last 8760 hours.  BMP: No results for input(s): NA, K, CL, CO2, GLUCOSE, BUN, CALCIUM, CREATININE, GFRNONAA, GFRAA in the last 8760 hours.  Invalid input(s): CMP  LIVER FUNCTION TESTS: No results for input(s): BILITOT, AST, ALT, ALKPHOS, PROT, ALBUMIN in the last 8760 hours.  TUMOR MARKERS: No results for input(s): AFPTM, CEA, CA199, CHROMGRNA in the last 8760 hours.  Assessment and Plan:  The lesion is not characteristic of vascular abnormality, likely benign cutaneous lesion such as sebaceous cyst. I don't think ultrasound or CT venography of the pelvis to look for venographic evidence of pelvic congestion syndrome would be fruitful given the lack of any symptomatology. I discussed the findings with the patient. She'll follow-up with her dermatologist regarding treatment options.  Thank you for this interesting consult.  I greatly enjoyed meeting SENA HOOPINGARNER and look forward to participating in their care.  Signed: Shatoria Stooksbury III, DAYNE Juanitta Earnhardt 07/30/2014, 4:12 PM   I spent a total of   20 Minutes   in face to face in clinical consultation, greater than 50% of which was counseling/coordinating care for left vulvar lesion.

## 2014-08-31 ENCOUNTER — Other Ambulatory Visit: Payer: Self-pay

## 2015-03-09 DIAGNOSIS — H353133 Nonexudative age-related macular degeneration, bilateral, advanced atrophic without subfoveal involvement: Secondary | ICD-10-CM | POA: Diagnosis not present

## 2015-03-24 DIAGNOSIS — Z23 Encounter for immunization: Secondary | ICD-10-CM | POA: Diagnosis not present

## 2015-03-24 DIAGNOSIS — M545 Low back pain: Secondary | ICD-10-CM | POA: Diagnosis not present

## 2015-03-25 ENCOUNTER — Other Ambulatory Visit: Payer: Self-pay | Admitting: Family Medicine

## 2015-03-25 DIAGNOSIS — M48061 Spinal stenosis, lumbar region without neurogenic claudication: Secondary | ICD-10-CM

## 2015-03-30 ENCOUNTER — Ambulatory Visit
Admission: RE | Admit: 2015-03-30 | Discharge: 2015-03-30 | Disposition: A | Discharge status: Home / Self Care | Payer: Medicare HMO | Source: Ambulatory Visit | Attending: Family Medicine | Admitting: Family Medicine

## 2015-03-30 DIAGNOSIS — M48061 Spinal stenosis, lumbar region without neurogenic claudication: Secondary | ICD-10-CM

## 2015-03-30 DIAGNOSIS — M4806 Spinal stenosis, lumbar region: Secondary | ICD-10-CM | POA: Diagnosis not present

## 2015-04-30 DIAGNOSIS — E785 Hyperlipidemia, unspecified: Secondary | ICD-10-CM | POA: Diagnosis not present

## 2015-04-30 DIAGNOSIS — Z0000 Encounter for general adult medical examination without abnormal findings: Secondary | ICD-10-CM | POA: Diagnosis not present

## 2015-04-30 DIAGNOSIS — Z1389 Encounter for screening for other disorder: Secondary | ICD-10-CM | POA: Diagnosis not present

## 2015-04-30 DIAGNOSIS — R739 Hyperglycemia, unspecified: Secondary | ICD-10-CM | POA: Diagnosis not present

## 2015-05-10 DIAGNOSIS — M545 Low back pain: Secondary | ICD-10-CM | POA: Diagnosis not present

## 2015-05-10 DIAGNOSIS — M5416 Radiculopathy, lumbar region: Secondary | ICD-10-CM | POA: Diagnosis not present

## 2015-06-02 ENCOUNTER — Ambulatory Visit: Payer: Medicare HMO | Attending: Orthopedic Surgery

## 2015-06-02 DIAGNOSIS — R293 Abnormal posture: Secondary | ICD-10-CM | POA: Diagnosis not present

## 2015-06-02 DIAGNOSIS — L720 Epidermal cyst: Secondary | ICD-10-CM | POA: Diagnosis not present

## 2015-06-02 DIAGNOSIS — L821 Other seborrheic keratosis: Secondary | ICD-10-CM | POA: Diagnosis not present

## 2015-06-02 DIAGNOSIS — D485 Neoplasm of uncertain behavior of skin: Secondary | ICD-10-CM | POA: Diagnosis not present

## 2015-06-02 DIAGNOSIS — M545 Low back pain, unspecified: Secondary | ICD-10-CM

## 2015-06-02 DIAGNOSIS — R202 Paresthesia of skin: Secondary | ICD-10-CM | POA: Diagnosis not present

## 2015-06-02 DIAGNOSIS — D1801 Hemangioma of skin and subcutaneous tissue: Secondary | ICD-10-CM | POA: Diagnosis not present

## 2015-06-02 DIAGNOSIS — Z85828 Personal history of other malignant neoplasm of skin: Secondary | ICD-10-CM | POA: Diagnosis not present

## 2015-06-02 DIAGNOSIS — M25652 Stiffness of left hip, not elsewhere classified: Secondary | ICD-10-CM | POA: Insufficient documentation

## 2015-06-02 NOTE — Patient Instructions (Signed)
Perform all exercises below:  Hold _20___ seconds. Repeat _3___ times.  Do __3__ sessions per day. CAUTION: Movement should be gentle, steady and slow.  Knee to Chest  Lying supine, bend involved knee to chest. Perform with each leg.  Copyright  VHI. All rights reserved.  Double Knee to Chest (Flexion)   Gently pull both knees toward chest. Feel stretch in lower back or buttock area. Breathing deeply, Lumbar Rotation: Caudal - Bilateral (Supine)  Feet and knees together, arms outstretched, rotate knees left, turning head in opposite direction, until stretch is felt.      HIP: Hamstrings - Short Sitting   Hell on the floor with toes up. Keep knee straight. Lift chest. Lean forward until a stretch is felt.  [facility] [address] [place], [place] [address] Phone # [phone] Fax [phone]

## 2015-06-02 NOTE — Therapy (Signed)
[facility] [address] [place], [place], [address] Phone: [phone]   Fax:  [phone]  Physical Therapy Evaluation  Patient Details  Name: Tiffany Mejia MRN: [id number] Date of Birth: January 18, 1936 Referring Provider: Phylliss Bob, MD  Encounter Date: 06/02/2015      PT End of Session - 06/02/15 1507    Visit Number 1   Number of Visits 10   Date for PT Re-Evaluation 07/28/15   PT Start Time [id number]   PT Stop Time 1514   PT Time Calculation (min) 39 min   Activity Tolerance Patient tolerated treatment well   Behavior During Therapy [facility] for tasks assessed/performed      Past Medical History  Diagnosis Date  . Hypertension   . HLD (hyperlipidemia)   . Macular degeneration   . Vitamin B12 deficiency   . Depression   . IBS (irritable bowel syndrome)   . GERD (gastroesophageal reflux disease)   . Nocturnal leg cramps   . Vertigo   . History of basal cell carcinoma   . Cervical spondylosis without myelopathy   . Lichen sclerosus of female genitalia   . Angiokeratoma     Past Surgical History  Procedure Laterality Date  . Cataract extraction Right   . Cervical spine surgery    . Gallbladder resection    . Tonsillectomy    . Appendectomy      There were no vitals filed for this visit.  Visit Diagnosis:  Bilateral low back pain without sciatica - Plan: PT plan of care cert/re-cert  Stiffness of left hip, not elsewhere classified - Plan: PT plan of care cert/re-cert  Abnormal posture - Plan: PT plan of care cert/re-cert      Subjective Assessment - 06/02/15 1428    Subjective Pt presents to PT with chronic history of LBP and Lt LE radiculopathy.  Pt reports that her Lt foot is always numb.  Pt has been prescribed with Gabapentin to help with these symptoms and Meloxicam for inflammation.  MD would like pt to attend 1-3 sessions for HEP only including flexion based exercise.     Pertinent History  MD orders: HEP only 1-3 visits, flexion based exercise.     Limitations Standing   How long can you stand comfortably? pain with cooking after 1 hour   Diagnostic tests MRI: January 2017.  Multilevel facet hypertrophy and mild DDD.  NCV in 2015 showed chronic L5 radiculpathy   Patient Stated Goals learn exercises: flexion based per MD.     Currently in Pain? Yes   Pain Score 1   up to 8/10    Pain Location Back   Pain Orientation Right;Left   Pain Descriptors / Indicators Tightness;Sore   Pain Type Chronic pain   Pain Onset More than a month ago   Pain Frequency Intermittent   Aggravating Factors  standing long periods to cook, night time   Pain Relieving Factors Aleve, rest/sitting            [facility] PT Assessment - 06/02/15 0001    Assessment   Medical Diagnosis LBP, facet arthritis   Referring Provider Phylliss Bob, MD   Onset Date/Surgical Date 06/01/13   Prior Therapy none recent   Precautions   Precautions Fall;Other (comment)  vision deficits   Restrictions   Weight Bearing Restrictions No   Balance Screen   Has the patient fallen in the past 6 months No   Has the patient had a decrease in  activity level because of a fear of falling?  No   Is the patient reluctant to leave their home because of a fear of falling?  No   Home Ecologist residence   Home Layout Two level   Prior Function   Level of [place] Retired   Leisure none   Cognition   Overall Cognitive Status Within Functional Limits for tasks assessed   Observation/Other Assessments   Focus on Therapeutic Outcomes (FOTO)  37% limitation   Posture/Postural Control   Posture/Postural Control Postural limitations   Postural Limitations Forward head;Rounded Shoulders;Decreased lumbar lordosis  thoracic scoliosis   Posture Comments thoracic scoliosis   ROM / Strength   AROM / PROM / Strength AROM;PROM;Strength   AROM   Overall AROM  Within  functional limits for tasks performed   Overall AROM Comments Lt Lumbar stiffness/pain over Lt lumbar spine and SI   PROM   Overall PROM  Deficits   Overall PROM Comments Lt hip flexibility is limited by 50% into ER and IR, flexion limited by 25% bilaterally.  Rt hip flexibility limited by 25% throughout   Strength   Overall Strength Within functional limits for tasks performed   Overall Strength Comments Lt=Rt hip strength. 4+/5 throughout   Palpation   Spinal mobility reduced spinal mobility in the thoracic and lumbar spine without pain.,   Palpation comment muscle tension and reduced muscle length over bil. lumbar paraspinals.  No significant palpable tenderness in the lumbar spine or deep gluteal.     Ambulation/Gait   Ambulation/Gait Yes   Ambulation/Gait Assistance 7: Independent   Ambulation Distance (Feet) 100 Feet   Gait Pattern Within Functional Limits                           PT Education - 06/02/15 1506    Education provided Yes   Education Details HEP: lumbar flexibility-flexion based   Person(s) Educated Patient   Methods Explanation;Demonstration;Handout   Comprehension Verbalized understanding;Returned demonstration             PT Long Term Goals - 06/02/15 1433    PT LONG TERM GOAL #1   Title be independent in advanced HEP   Time 4   Period Weeks   Status New   PT LONG TERM GOAL #2   Title reduce FOTO to < or = to 33% limitation   Time 4   Period Weeks   Status New   PT LONG TERM GOAL #3   Title verbalize and demonstrate correct body mechanics for ADLs and gym exercises   Time 4   Period Weeks   Status New   PT LONG TERM GOAL #4   Title verbalize understanding of progression of HEP for core, flexibility and gym exercises   Time 4   Period Weeks   Status New               Plan - 06/02/15 1508    Clinical Impression Statement Pt presents to PT with complaints of chronic LBP and Lt LE radiculopathy.  Pt had recent MRI  that showed lumbar DDD and facet arthritis.  Old NCV test showed chronic L5 radiculopathy.  MD would like pt to attend ~1 month to lean flexion based exercises and transition to a gym exercis program.  Pt demonstrates Lt>Rt hip flexibility deficits, reduced spinal mobility and tension in Lt>Rt paraspinals.  Pt has not received body mechanics  education and will benefit from PT for body mechanics education for safety at the gym, flexion based exercise and core strength exercises.     Pt will benefit from skilled therapeutic intervention in order to improve on the following deficits Pain;Impaired flexibility;Improper body mechanics;Decreased activity tolerance;Postural dysfunction   Rehab Potential Good   PT Frequency 1x / week   PT Duration 4 weeks   PT Treatment/Interventions ADLs/Self Care Home Management;Cryotherapy;Moist Heat;Therapeutic exercise;Therapeutic activities;Functional mobility training;Neuromuscular re-education;Patient/family education;Passive range of motion;Manual techniques   PT Next Visit Plan Body mechanics education, flexion based exercise per MD orders, HEP for core strength, gym exercises with education regarding correct technique and body mechanics   Consulted and Agree with Plan of Care Patient          G-Codes - 2015-06-22 1433    Functional Assessment Tool Used FOTO: 37% limitation   Functional Limitation Other PT primary   Other PT Primary Current Status [id number]) At least 20 percent but less than 40 percent impaired, limited or restricted   Other PT Primary Goal Status [id number]) At least 20 percent but less than 40 percent impaired, limited or restricted       Problem List Patient Active Problem List   Diagnosis Date Noted  . Disturbance of skin sensation 04/02/2013  . Pain in limb 04/02/2013   Sigurd Sos, PT 22-Jun-2015 3:24 PM  [facility] [address] [place], [place], [address] Phone:  [phone]   Fax:  [phone]  Name: Tiffany Mejia MRN: [id number] Date of Birth: February 05, 1936

## 2015-06-09 ENCOUNTER — Ambulatory Visit: Payer: Medicare HMO | Attending: Orthopedic Surgery | Admitting: Physical Therapy

## 2015-06-09 ENCOUNTER — Encounter: Payer: Self-pay | Admitting: Physical Therapy

## 2015-06-09 DIAGNOSIS — M545 Low back pain, unspecified: Secondary | ICD-10-CM

## 2015-06-09 DIAGNOSIS — M25652 Stiffness of left hip, not elsewhere classified: Secondary | ICD-10-CM | POA: Diagnosis not present

## 2015-06-09 DIAGNOSIS — R293 Abnormal posture: Secondary | ICD-10-CM

## 2015-06-09 DIAGNOSIS — H53419 Scotoma involving central area, unspecified eye: Secondary | ICD-10-CM | POA: Diagnosis not present

## 2015-06-09 NOTE — Patient Instructions (Signed)
   Lifting Principles  .Maintain proper posture and head alignment. .Slide object as close as possible before lifting. .Move obstacles out of the way. .Test before lifting; ask for help if too heavy. .Tighten stomach muscles without holding breath. .Use smooth movements; do not jerk. .Use legs to do the work, and pivot with feet. .Distribute the work load symmetrically and close to the center of trunk. .Push instead of pull whenever possible.   Squat down and hold basket close to stand. Use leg muscles to do the work.    Avoid twisting or bending back. Pivot around using foot movements, and bend at knees if needed when reaching for articles.        Getting Into / Out of Bed   Lower self to lie down on one side by raising legs and lowering head at the same time. Use arms to assist moving without twisting. Bend both knees to roll onto back if desired. To sit up, start from lying on side, and use same move-ments in reverse. Keep trunk aligned with legs.    Shift weight from front foot to back foot as item is lifted off shelf.    When leaning forward to pick object up from floor, extend one leg out behind. Keep back straight. Hold onto a sturdy support with other hand.      Sit upright, head facing forward. Try using a roll to support lower back. Keep shoulders relaxed, and avoid rounded back. Keep hips level with knees. Avoid crossing legs for long periods.    Bridge   Lie back, legs bent. Inhale, pressing hips up. Keeping ribs in, lengthen lower back. Exhale, rolling down along spine from top. Repeat __10__ times. Do __2-3__ sessions per day.  Copyright  VHI. All rights reserved.   Pelvic Tilt   Flatten back by tightening stomach muscles and buttocks. Repeat 10____ times per set. Do _2___ sets per session. Do _2-3___ sessions per day.  http://orth.exer.us/134

## 2015-06-09 NOTE — Therapy (Addendum)
[facility] [address], [place], [place], [address] Phone: [phone]   Fax:  [phone]  Physical Therapy Treatment  Patient Details  Name: Tiffany Mejia MRN: [id number] Date of Birth: [birth year] Referring Provider: Phylliss Bob   Encounter Date: 06/09/2015      PT End of Session - 06/15/15 1225    Visit Number 3   Number of Visits 10   Date for PT Re-Evaluation 07/28/15   PT Start Time 1146   PT Stop Time 1230   PT Time Calculation (min) 44 min   Activity Tolerance Patient tolerated treatment well      Past Medical History  Diagnosis Date  . Hypertension   . HLD (hyperlipidemia)   . Macular degeneration   . Vitamin B12 deficiency   . Depression   . IBS (irritable bowel syndrome)   . GERD (gastroesophageal reflux disease)   . Nocturnal leg cramps   . Vertigo   . History of basal cell carcinoma   . Cervical spondylosis without myelopathy   . Lichen sclerosus of female genitalia   . Angiokeratoma     Past Surgical History  Procedure Laterality Date  . Cataract extraction Right   . Cervical spine surgery    . Gallbladder resection    . Tonsillectomy    . Appendectomy      There were no vitals filed for this visit.  Visit Diagnosis:  Bilateral low back pain without sciatica  Stiffness of left hip, not elsewhere classified  Abnormal posture      Subjective Assessment - 06/15/15 1149    Subjective Pt reports pain in Rt SIJ area and rated as 2/10. Pt knows her exercises and wishes to be D/C    Pertinent History MD orders: HEP only 1-3 visits, flexion based exercise.  chronic LBP and Lt LE radiculopathy, per pt report Lt foot is always numb - using Gabapentin to help with these symptoms & Meloxicam for inflammation.    Limitations Standing   How long can you stand comfortably? pain with cooking after 1 hour   Diagnostic tests MRI: January 2017.  Multilevel facet hypertrophy and mild DDD.   NCV in 2015 showed chronic L5 radiculpathy   Patient Stated Goals learn exercises: flexion based per MD.     Currently in Pain? Yes   Pain Score 2    Pain Location Back   Pain Orientation Right;Left   Pain Descriptors / Indicators Tightness;Sore   Pain Type Chronic pain   Pain Onset More than a month ago   Pain Frequency Intermittent   Aggravating Factors  prolonged sitting and standing   Pain Relieving Factors Aleve, rest/sitting   Multiple Pain Sites No            [facility] PT Assessment - 06/15/15 0001    Assessment   Medical Diagnosis LBP, facet arthritis   Referring Provider dumonski Mark    Onset Date/Surgical Date 06/01/13   Prior Therapy none recent   Precautions   Precautions Other (comment)  vision deficits   Restrictions   Weight Bearing Restrictions No   Balance Screen   Has the patient fallen in the past 6 months No   Has the patient had a decrease in activity level because of a fear of falling?  No   Is the patient reluctant to leave their home because of a fear of falling?  No   Home Ecologist residence   Home Layout  Two level   Prior Function   Level of Independence Independent   Vocation Retired   Leisure none   Cognition   Overall Cognitive Status Within Functional Limits for tasks assessed   Observation/Other Assessments   Focus on Therapeutic Outcomes (FOTO)  42% limitation   ROM / Strength   AROM / PROM / Strength AROM   AROM   Overall AROM  Within functional limits for tasks performed   Overall AROM Comments Lt Lumbar stiffness/pain over Lt lumbar spine and SI   PROM   Overall PROM  Deficits   Overall PROM Comments Lt hip flexibility is limited by 50% into ER and IR, flexion limited by 25% bilaterally.  Rt hip flexibility limited by 25% throughout   Strength   Overall Strength Within functional limits for tasks performed   Overall Strength Comments Lt=Rt hip strength. 4+/5 throughout   Palpation   Spinal  mobility reduced spinal mobility in the thoracic and lumbar spine without pain.,   Palpation comment muscle tension and reduced muscle length over bil. lumbar paraspinals.  No significant palpable tenderness in the lumbar spine or deep gluteal.     Bed Mobility   Bed Mobility --  reviewed bodymechanics and practiced bending   Ambulation/Gait   Ambulation/Gait Yes   Ambulation/Gait Assistance 7: Independent   Ambulation Distance (Feet) 100 Feet   Gait Pattern Within Functional Limits                     [facility] Adult PT Treatment/Exercise - 06/15/15 0001    Posture/Postural Control   Posture/Postural Control No significant limitations   Postural Limitations Forward head;Rounded Shoulders;Decreased lumbar lordosis   Posture Comments thoracic scoliosis   Self-Care   Self-Care --  Discused options how to commute to gym, if not more driving    Exercises   Exercises Lumbar   Lumbar Exercises: Stretches   Active Hamstring Stretch 3 reps;20 seconds   Single Knee to Chest Stretch 3 reps;20 seconds   Double Knee to Chest Stretch 3 reps;20 seconds   Lower Trunk Rotation 3 reps;20 seconds   Lumbar Exercises: Aerobic   Stationary Bike Nu-step L1 x 79mn   Modalities   Modalities Ultrasound   Ultrasound   Ultrasound Location low back   Ultrasound Parameters 100%, 1Mhz,  1W/cm   Ultrasound Goals Pain   Manual Therapy   Manual Therapy Soft tissue mobilization                     PT Long Term Goals - 06/15/15 1228    PT LONG TERM GOAL #1   Title be independent in advanced HEP   Time 4   Period Weeks   Status Achieved   PT LONG TERM GOAL #2   Title reduce FOTO to < or = to 33% limitation  42% as of April 01/2016   Time 4   Period Weeks   Status Not Met   PT LONG TERM GOAL #3   Title verbalize and demonstrate correct body mechanics for ADLs and gym exercises   Time 4   Period Weeks   Status Achieved   PT LONG TERM GOAL #4   Title verbalize understanding  of progression of HEP for core, flexibility and gym exercises   Status Partially Met               Plan - 06/15/15 1226    Clinical Impression Statement Pt with chronic back pain, but independent with  her Home exercise program, and plans to go back to the gym. She is concerned about her vison deficit, advised to use ueber service tfor transportation. Pt wishes to be D/Cto HEP    Rehab Potential Good   Clinical Impairments Affecting Rehab Potential MRI: lumbar DDD and facet arthritis. Old NCV test showed chronic radiculopathy.    PT Frequency 1x / week   PT Duration 4 weeks   PT Treatment/Interventions ADLs/Self Care Home Management;Cryotherapy;Moist Heat;Therapeutic exercise;Therapeutic activities;Functional mobility training;Neuromuscular re-education;Patient/family education;Passive range of motion;Manual techniques   PT Next Visit Plan Review body mechanics education, flexion based exercise per MD orders, HEP for core strength, gym exercises with education regarding correct technique and body mechanics   Consulted and Agree with Plan of Care Patient          G-Codes - 07-04-2015 1403    Functional Assessment Tool Used FOTO: 42% limitation   Functional Limitation Other PT primary   Other PT Primary Goal Status (J0048) At least 20 percent but less than 40 percent impaired, limited or restricted   Other PT Primary Discharge Status (E9865) At least 40 percent but less than 60 percent impaired, limited or restricted      Problem List Patient Active Problem List   Diagnosis Date Noted  . Disturbance of skin sensation 04/02/2013  . Pain in limb 04/02/2013    GRAY,CHERYL PTA 2015-07-04, 2:10 PM  [facility] [address], [place], [place], [address] Phone: [phone]   Fax:  [phone]  Name: Tiffany Mejia MRN: [id number] Date of Birth: Jul 18, 1935

## 2015-06-15 ENCOUNTER — Ambulatory Visit: Payer: Medicare HMO | Admitting: Physical Therapy

## 2015-06-15 ENCOUNTER — Encounter: Payer: Self-pay | Admitting: Physical Therapy

## 2015-06-15 DIAGNOSIS — M545 Low back pain, unspecified: Secondary | ICD-10-CM

## 2015-06-15 DIAGNOSIS — R293 Abnormal posture: Secondary | ICD-10-CM

## 2015-06-15 NOTE — Therapy (Signed)
[facility] [address], [place], [place], [address] Phone: [phone]   Fax:  [phone]  Physical Therapy Treatment  Patient Details  Name: Tiffany Mejia MRN: [id number] Date of Birth: 09/06/1935 Referring Provider: Phylliss Bob   Encounter Date: 06/15/2015      PT End of Session - 06/15/15 1225    Visit Number 3   Number of Visits 10   Date for PT Re-Evaluation 07/28/15   PT Start Time 1146   PT Stop Time 1230   PT Time Calculation (min) 44 min   Activity Tolerance Patient tolerated treatment well      Past Medical History  Diagnosis Date  . Hypertension   . HLD (hyperlipidemia)   . Macular degeneration   . Vitamin B12 deficiency   . Depression   . IBS (irritable bowel syndrome)   . GERD (gastroesophageal reflux disease)   . Nocturnal leg cramps   . Vertigo   . History of basal cell carcinoma   . Cervical spondylosis without myelopathy   . Lichen sclerosus of female genitalia   . Angiokeratoma     Past Surgical History  Procedure Laterality Date  . Cataract extraction Right   . Cervical spine surgery    . Gallbladder resection    . Tonsillectomy    . Appendectomy      There were no vitals filed for this visit.      Subjective Assessment - 06/15/15 1149    Subjective Pt reports pain in Rt SIJ area and rated as 2/10. Pt knows her exercises and wishes to be D/C    Pertinent History MD orders: HEP only 1-3 visits, flexion based exercise.  chronic LBP and Lt LE radiculopathy, per pt report Lt foot is always numb - using Gabapentin to help with these symptoms & Meloxicam for inflammation.    Limitations Standing   How long can you stand comfortably? pain with cooking after 1 hour   Diagnostic tests MRI: January 2017.  Multilevel facet hypertrophy and mild DDD.  NCV in 2015 showed chronic L5 radiculpathy   Patient Stated Goals learn exercises: flexion based per MD.     Currently in Pain? Yes    Pain Score 2    Pain Location Back   Pain Orientation Right;Left   Pain Descriptors / Indicators Tightness;Sore   Pain Type Chronic pain   Pain Onset More than a month ago   Pain Frequency Intermittent   Aggravating Factors  prolonged sitting and standing   Pain Relieving Factors Aleve, rest/sitting   Multiple Pain Sites No            [facility] PT Assessment - 06/15/15 0001    Assessment   Medical Diagnosis LBP, facet arthritis   Referring Provider dumonski Mark    Onset Date/Surgical Date 06/01/13   Prior Therapy none recent   Precautions   Precautions Other (comment)  vision deficits   Restrictions   Weight Bearing Restrictions No   Balance Screen   Has the patient fallen in the past 6 months No   Has the patient had a decrease in activity level because of a fear of falling?  No   Is the patient reluctant to leave their home because of a fear of falling?  No   Home Ecologist residence   Home Layout Two level   Prior Function   Level of Independence Independent   Vocation Retired   Leisure none  Cognition   Overall Cognitive Status Within Functional Limits for tasks assessed   Observation/Other Assessments   Focus on Therapeutic Outcomes (FOTO)  42% limitation   ROM / Strength   AROM / PROM / Strength AROM   AROM   Overall AROM  Within functional limits for tasks performed   Overall AROM Comments Lt Lumbar stiffness/pain over Lt lumbar spine and SI   PROM   Overall PROM  Deficits   Overall PROM Comments Lt hip flexibility is limited by 50% into ER and IR, flexion limited by 25% bilaterally.  Rt hip flexibility limited by 25% throughout   Strength   Overall Strength Within functional limits for tasks performed   Overall Strength Comments Lt=Rt hip strength. 4+/5 throughout   Palpation   Spinal mobility reduced spinal mobility in the thoracic and lumbar spine without pain.,   Palpation comment muscle tension and reduced muscle  length over bil. lumbar paraspinals.  No significant palpable tenderness in the lumbar spine or deep gluteal.     Bed Mobility   Bed Mobility --  reviewed bodymechanics and practiced bending   Ambulation/Gait   Ambulation/Gait Yes   Ambulation/Gait Assistance 7: Independent   Ambulation Distance (Feet) 100 Feet   Gait Pattern Within Functional Limits                     [facility] Adult PT Treatment/Exercise - 06/15/15 0001    Posture/Postural Control   Posture/Postural Control No significant limitations   Postural Limitations Forward head;Rounded Shoulders;Decreased lumbar lordosis   Posture Comments thoracic scoliosis   Self-Care   Self-Care --  Discused options how to commute to gym, if not more driving    Exercises   Exercises Lumbar   Lumbar Exercises: Stretches   Active Hamstring Stretch 3 reps;20 seconds   Single Knee to Chest Stretch 3 reps;20 seconds   Double Knee to Chest Stretch 3 reps;20 seconds   Lower Trunk Rotation 3 reps;20 seconds   Lumbar Exercises: Aerobic   Stationary Bike Nu-step L1 x 68mn   Modalities   Modalities Ultrasound   Ultrasound   Ultrasound Location low back   Ultrasound Parameters 100%, 1Mhz,  1W/cm   Ultrasound Goals Pain   Manual Therapy   Manual Therapy Soft tissue mobilization       Physical therapist reviewed goals with patient.  Patient is requesting to be discharged due to her knowing the exercises and wants to do them at home.  CEarlie Counts PT 06/15/2015 2:07 PM                PT Long Term Goals - 06/15/15 1228    PT LONG TERM GOAL #1   Title be independent in advanced HEP   Time 4   Period Weeks   Status Achieved   PT LONG TERM GOAL #2   Title reduce FOTO to < or = to 33% limitation  42% as of April 01/2016   Time 4   Period Weeks   Status Not Met   PT LONG TERM GOAL #3   Title verbalize and demonstrate correct body mechanics for ADLs and gym exercises   Time 4   Period Weeks   Status Achieved    PT LONG TERM GOAL #4   Title verbalize understanding of progression of HEP for core, flexibility and gym exercises   Status Partially Met               Plan - 06/15/15 1226  Clinical Impression Statement Pt with chronic back pain, but independent with her Home exercise program, and plans to go back to the gym. She is concerned about her vison deficit, advised to use ueber service tfor transportation. Pt wishes to be D/Cto HEP    Rehab Potential Good   Clinical Impairments Affecting Rehab Potential MRI: lumbar DDD and facet arthritis. Old NCV test showed chronic radiculopathy.    PT Frequency 1x / week   PT Duration 4 weeks   PT Treatment/Interventions ADLs/Self Care Home Management;Cryotherapy;Moist Heat;Therapeutic exercise;Therapeutic activities;Functional mobility training;Neuromuscular re-education;Patient/family education;Passive range of motion;Manual techniques   PT Next Visit Plan Review body mechanics education, flexion based exercise per MD orders, HEP for core strength, gym exercises with education regarding correct technique and body mechanics   Consulted and Agree with Plan of Care Patient      Patient will benefit from skilled therapeutic intervention in order to improve the following deficits and impairments:  Pain, Impaired flexibility, Improper body mechanics, Decreased activity tolerance, Postural dysfunction  Visit Diagnosis: Bilateral low back pain without sciatica  Abnormal posture       G-Codes - 07/03/2015 1403    Functional Assessment Tool Used FOTO: 42% limitation   Functional Limitation Other PT primary   Other PT Primary Goal Status (V4008) At least 20 percent but less than 40 percent impaired, limited or restricted   Other PT Primary Discharge Status (Q7619) At least 40 percent but less than 60 percent impaired, limited or restricted      Problem List Patient Active Problem List   Diagnosis Date Noted  . Disturbance of skin sensation  04/02/2013  . Pain in limb 04/02/2013    Norman Bier Naumann-Houegnifio, PTA Jul 03, 2015 2:07 PM   [facility] [address], [place], [place], [address] Phone: [phone]   Fax:  [phone]  Name: KIM OKI MRN: [id number] Date of Birth: [birth year]    PHYSICAL THERAPY DISCHARGE SUMMARY  Visits from Start of Care: 3  Current functional level related to goals / functional outcomes: See above.   Remaining deficits: See above  Education / Equipment: HEP  Plan: Patient agrees to discharge.  Patient goals were partially met. Patient is being discharged due to the patient's request. Thank you for the referral. Earlie Counts, PT 2015/07/03 2:07 PM   ?????

## 2015-07-08 DIAGNOSIS — H353114 Nonexudative age-related macular degeneration, right eye, advanced atrophic with subfoveal involvement: Secondary | ICD-10-CM | POA: Diagnosis not present

## 2015-07-08 DIAGNOSIS — H353124 Nonexudative age-related macular degeneration, left eye, advanced atrophic with subfoveal involvement: Secondary | ICD-10-CM | POA: Diagnosis not present

## 2015-07-08 DIAGNOSIS — H26491 Other secondary cataract, right eye: Secondary | ICD-10-CM | POA: Diagnosis not present

## 2015-07-08 DIAGNOSIS — Z961 Presence of intraocular lens: Secondary | ICD-10-CM | POA: Diagnosis not present

## 2015-08-09 DIAGNOSIS — R69 Illness, unspecified: Secondary | ICD-10-CM | POA: Diagnosis not present

## 2015-12-10 DIAGNOSIS — R69 Illness, unspecified: Secondary | ICD-10-CM | POA: Diagnosis not present

## 2015-12-21 DIAGNOSIS — H541213 Low vision right eye category 1, blindness left eye category 3: Secondary | ICD-10-CM | POA: Diagnosis not present

## 2015-12-21 DIAGNOSIS — H353114 Nonexudative age-related macular degeneration, right eye, advanced atrophic with subfoveal involvement: Secondary | ICD-10-CM | POA: Diagnosis not present

## 2015-12-21 DIAGNOSIS — H53413 Scotoma involving central area, bilateral: Secondary | ICD-10-CM | POA: Diagnosis not present

## 2015-12-24 DIAGNOSIS — Z0000 Encounter for general adult medical examination without abnormal findings: Secondary | ICD-10-CM | POA: Diagnosis not present

## 2015-12-24 DIAGNOSIS — I10 Essential (primary) hypertension: Secondary | ICD-10-CM | POA: Diagnosis not present

## 2015-12-27 DIAGNOSIS — H353114 Nonexudative age-related macular degeneration, right eye, advanced atrophic with subfoveal involvement: Secondary | ICD-10-CM | POA: Diagnosis not present

## 2015-12-27 DIAGNOSIS — H53413 Scotoma involving central area, bilateral: Secondary | ICD-10-CM | POA: Diagnosis not present

## 2015-12-27 DIAGNOSIS — H541213 Low vision right eye category 1, blindness left eye category 3: Secondary | ICD-10-CM | POA: Diagnosis not present

## 2016-01-03 DIAGNOSIS — J029 Acute pharyngitis, unspecified: Secondary | ICD-10-CM | POA: Diagnosis not present

## 2016-01-03 DIAGNOSIS — R6883 Chills (without fever): Secondary | ICD-10-CM | POA: Diagnosis not present

## 2016-01-12 DIAGNOSIS — H353114 Nonexudative age-related macular degeneration, right eye, advanced atrophic with subfoveal involvement: Secondary | ICD-10-CM | POA: Diagnosis not present

## 2016-01-12 DIAGNOSIS — H53413 Scotoma involving central area, bilateral: Secondary | ICD-10-CM | POA: Diagnosis not present

## 2016-01-12 DIAGNOSIS — H541213 Low vision right eye category 1, blindness left eye category 3: Secondary | ICD-10-CM | POA: Diagnosis not present

## 2016-01-26 DIAGNOSIS — H541213 Low vision right eye category 1, blindness left eye category 3: Secondary | ICD-10-CM | POA: Diagnosis not present

## 2016-01-26 DIAGNOSIS — H353114 Nonexudative age-related macular degeneration, right eye, advanced atrophic with subfoveal involvement: Secondary | ICD-10-CM | POA: Diagnosis not present

## 2016-01-26 DIAGNOSIS — H53413 Scotoma involving central area, bilateral: Secondary | ICD-10-CM | POA: Diagnosis not present

## 2016-02-02 DIAGNOSIS — H541213 Low vision right eye category 1, blindness left eye category 3: Secondary | ICD-10-CM | POA: Diagnosis not present

## 2016-02-02 DIAGNOSIS — H353114 Nonexudative age-related macular degeneration, right eye, advanced atrophic with subfoveal involvement: Secondary | ICD-10-CM | POA: Diagnosis not present

## 2016-02-02 DIAGNOSIS — H53413 Scotoma involving central area, bilateral: Secondary | ICD-10-CM | POA: Diagnosis not present

## 2016-02-10 DIAGNOSIS — H353124 Nonexudative age-related macular degeneration, left eye, advanced atrophic with subfoveal involvement: Secondary | ICD-10-CM | POA: Diagnosis not present

## 2016-02-10 DIAGNOSIS — H353114 Nonexudative age-related macular degeneration, right eye, advanced atrophic with subfoveal involvement: Secondary | ICD-10-CM | POA: Diagnosis not present

## 2016-02-10 DIAGNOSIS — H26491 Other secondary cataract, right eye: Secondary | ICD-10-CM | POA: Diagnosis not present

## 2016-02-10 DIAGNOSIS — H25812 Combined forms of age-related cataract, left eye: Secondary | ICD-10-CM | POA: Diagnosis not present

## 2016-02-10 DIAGNOSIS — Z961 Presence of intraocular lens: Secondary | ICD-10-CM | POA: Diagnosis not present

## 2016-02-10 DIAGNOSIS — H353190 Nonexudative age-related macular degeneration, unspecified eye, stage unspecified: Secondary | ICD-10-CM | POA: Diagnosis not present

## 2016-03-09 DIAGNOSIS — J101 Influenza due to other identified influenza virus with other respiratory manifestations: Secondary | ICD-10-CM | POA: Diagnosis not present

## 2016-03-09 DIAGNOSIS — J329 Chronic sinusitis, unspecified: Secondary | ICD-10-CM | POA: Diagnosis not present

## 2016-03-09 DIAGNOSIS — R05 Cough: Secondary | ICD-10-CM | POA: Diagnosis not present

## 2016-05-03 DIAGNOSIS — R69 Illness, unspecified: Secondary | ICD-10-CM | POA: Diagnosis not present

## 2016-05-08 DIAGNOSIS — L900 Lichen sclerosus et atrophicus: Secondary | ICD-10-CM | POA: Diagnosis not present

## 2016-05-08 DIAGNOSIS — E538 Deficiency of other specified B group vitamins: Secondary | ICD-10-CM | POA: Diagnosis not present

## 2016-05-08 DIAGNOSIS — I10 Essential (primary) hypertension: Secondary | ICD-10-CM | POA: Diagnosis not present

## 2016-05-08 DIAGNOSIS — E785 Hyperlipidemia, unspecified: Secondary | ICD-10-CM | POA: Diagnosis not present

## 2016-05-08 DIAGNOSIS — E559 Vitamin D deficiency, unspecified: Secondary | ICD-10-CM | POA: Diagnosis not present

## 2016-05-08 DIAGNOSIS — E611 Iron deficiency: Secondary | ICD-10-CM | POA: Diagnosis not present

## 2016-05-08 DIAGNOSIS — Z0000 Encounter for general adult medical examination without abnormal findings: Secondary | ICD-10-CM | POA: Diagnosis not present

## 2016-05-08 DIAGNOSIS — R5382 Chronic fatigue, unspecified: Secondary | ICD-10-CM | POA: Diagnosis not present

## 2016-05-08 DIAGNOSIS — R399 Unspecified symptoms and signs involving the genitourinary system: Secondary | ICD-10-CM | POA: Diagnosis not present

## 2016-06-01 DIAGNOSIS — C4441 Basal cell carcinoma of skin of scalp and neck: Secondary | ICD-10-CM | POA: Diagnosis not present

## 2016-06-01 DIAGNOSIS — L821 Other seborrheic keratosis: Secondary | ICD-10-CM | POA: Diagnosis not present

## 2016-06-01 DIAGNOSIS — L814 Other melanin hyperpigmentation: Secondary | ICD-10-CM | POA: Diagnosis not present

## 2016-06-01 DIAGNOSIS — Z85828 Personal history of other malignant neoplasm of skin: Secondary | ICD-10-CM | POA: Diagnosis not present

## 2016-06-01 DIAGNOSIS — D1801 Hemangioma of skin and subcutaneous tissue: Secondary | ICD-10-CM | POA: Diagnosis not present

## 2016-06-01 DIAGNOSIS — D485 Neoplasm of uncertain behavior of skin: Secondary | ICD-10-CM | POA: Diagnosis not present

## 2016-06-01 DIAGNOSIS — L570 Actinic keratosis: Secondary | ICD-10-CM | POA: Diagnosis not present

## 2016-06-01 DIAGNOSIS — B351 Tinea unguium: Secondary | ICD-10-CM | POA: Diagnosis not present

## 2016-07-11 DIAGNOSIS — C4441 Basal cell carcinoma of skin of scalp and neck: Secondary | ICD-10-CM | POA: Diagnosis not present

## 2016-09-27 DIAGNOSIS — H01001 Unspecified blepharitis right upper eyelid: Secondary | ICD-10-CM | POA: Diagnosis not present

## 2016-09-27 DIAGNOSIS — H01004 Unspecified blepharitis left upper eyelid: Secondary | ICD-10-CM | POA: Diagnosis not present

## 2016-10-05 DIAGNOSIS — M79605 Pain in left leg: Secondary | ICD-10-CM | POA: Diagnosis not present

## 2016-10-09 DIAGNOSIS — M545 Low back pain: Secondary | ICD-10-CM | POA: Diagnosis not present

## 2016-10-09 DIAGNOSIS — M7062 Trochanteric bursitis, left hip: Secondary | ICD-10-CM | POA: Diagnosis not present

## 2016-10-30 DIAGNOSIS — Z23 Encounter for immunization: Secondary | ICD-10-CM | POA: Diagnosis not present

## 2016-10-30 DIAGNOSIS — N3000 Acute cystitis without hematuria: Secondary | ICD-10-CM | POA: Diagnosis not present

## 2016-10-30 DIAGNOSIS — H8111 Benign paroxysmal vertigo, right ear: Secondary | ICD-10-CM | POA: Diagnosis not present

## 2016-10-30 DIAGNOSIS — R3981 Functional urinary incontinence: Secondary | ICD-10-CM | POA: Diagnosis not present

## 2016-11-01 DIAGNOSIS — R69 Illness, unspecified: Secondary | ICD-10-CM | POA: Diagnosis not present

## 2016-11-17 DIAGNOSIS — K2900 Acute gastritis without bleeding: Secondary | ICD-10-CM | POA: Diagnosis not present

## 2016-11-27 DIAGNOSIS — H8111 Benign paroxysmal vertigo, right ear: Secondary | ICD-10-CM | POA: Diagnosis not present

## 2016-11-27 DIAGNOSIS — H903 Sensorineural hearing loss, bilateral: Secondary | ICD-10-CM | POA: Diagnosis not present

## 2016-12-04 ENCOUNTER — Ambulatory Visit: Payer: Medicare HMO | Attending: Family Medicine | Admitting: Physical Therapy

## 2016-12-04 DIAGNOSIS — R42 Dizziness and giddiness: Secondary | ICD-10-CM | POA: Insufficient documentation

## 2016-12-04 DIAGNOSIS — R2681 Unsteadiness on feet: Secondary | ICD-10-CM | POA: Insufficient documentation

## 2016-12-04 DIAGNOSIS — H8111 Benign paroxysmal vertigo, right ear: Secondary | ICD-10-CM | POA: Insufficient documentation

## 2016-12-04 NOTE — Therapy (Signed)
[facility] [address] [place], [place], [address] Phone: [phone]   Fax:  [phone]  Physical Therapy Evaluation  Patient Details  Name: Tiffany Mejia MRN: [id number] Date of Birth: [birth year] Referring Provider: Dr. Maurice Small  Encounter Date: 12/04/2016      PT End of Session - 12/04/16 1434    Visit Number 1   Number of Visits 6   Date for PT Re-Evaluation 01/15/17   PT Start Time 1400   PT Stop Time 1433   PT Time Calculation (min) 33 min   Activity Tolerance Patient tolerated treatment well   Behavior During Therapy [facility] for tasks assessed/performed      Past Medical History:  Diagnosis Date  . Angiokeratoma   . Cervical spondylosis without myelopathy   . Depression   . GERD (gastroesophageal reflux disease)   . History of basal cell carcinoma   . HLD (hyperlipidemia)   . Hypertension   . IBS (irritable bowel syndrome)   . Lichen sclerosus of female genitalia   . Macular degeneration   . Nocturnal leg cramps   . Vertigo   . Vitamin B12 deficiency     Past Surgical History:  Procedure Laterality Date  . APPENDECTOMY    . CATARACT EXTRACTION Right   . CERVICAL SPINE SURGERY    . gallbladder resection    . TONSILLECTOMY      There were no vitals filed for this visit.       Subjective Assessment - 12/04/16 1404    Subjective Pt is a 81 y/o female who presents to OPPT for vertigo.  Pt reports symptoms returned at end of August with sudden onset.     Patient Stated Goals resolve dizziness   Currently in Pain? No/denies            [facility] PT Assessment - 12/04/16 1406      Assessment   Medical Diagnosis BPPV   Referring Provider Dr. Maurice Small   Onset Date/Surgical Date --  Aug 2018   Next MD Visit PRN   Prior Therapy at this clinic in 2013     Precautions   Precautions None     Restrictions   Weight Bearing Restrictions No     Balance Screen   Has the patient fallen  in the past 6 months No   Has the patient had a decrease in activity level because of a fear of falling?  Yes   Is the patient reluctant to leave their home because of a fear of falling?  No     Home Environment   Living Environment Private residence   Living Arrangements Alone   Type of [place] to enter   Entrance Stairs-Number of Steps 2   Entrance Stairs-Rails Right;Left;Cannot reach both   [place] Two level;Bed/bath upstairs   Alternate Level Stairs-Number of Steps 13   Alternate Level Stairs-Rails Right     Prior Function   Level of Independence Independent   Vocation Retired   Leisure lunch with friends     Cognition   Overall Cognitive Status Within Functional Limits for tasks assessed     Observation/Other Assessments   Focus on Therapeutic Outcomes (FOTO)  unable to complete due to vision impairment     Ambulation/Gait   Ambulation/Gait Yes   Ambulation/Gait Assistance 5: Supervision;4: Min guard   Ambulation/Gait Assistance Details some staggering noted with slight head turns; pt able to self correct  Ambulation Distance (Feet) 100 Feet            Vestibular Assessment - 12/04/16 1411      Vestibular Assessment   General Observation "I feel like my head's a bowling ball"     Symptom Behavior   Type of Dizziness "World moves"  "like I'm on a ship"   Frequency of Dizziness all day; bending over, lying down and rolling to Rt   Duration of Dizziness seconds   Aggravating Factors Lying supine;Rolling to right;Forward bending   Relieving Factors Rest;Head stationary     Occulomotor Exam   Occulomotor Alignment Normal   Spontaneous Absent   Gaze-induced Absent   Smooth Pursuits Intact   Saccades Comment  overshooting     Positional Testing   Dix-Hallpike Dix-Hallpike Right;Dix-Hallpike Left     Dix-Hallpike Right   Dix-Hallpike Right Duration 5-7 sec   Dix-Hallpike Right Symptoms Upbeat, right rotatory nystagmus      Dix-Hallpike Left   Dix-Hallpike Left Duration 3-4 sec   Dix-Hallpike Left Symptoms Downbeat, left rotatory nystagmus        Objective measurements completed on examination: See above findings.           Vestibular Treatment/Exercise - 12/04/16 1433      Vestibular Treatment/Exercise   Vestibular Treatment Provided Canalith Repositioning   Canalith Repositioning Epley Manuever Right      EPLEY MANUEVER RIGHT   Number of Reps  2   Overall Response Improved Symptoms               PT Education - 12/04/16 1434    Education provided Yes   Education Details BPPV   Person(s) Educated Patient   Methods Explanation;Handout   Comprehension Verbalized understanding             PT Long Term Goals - 12/04/16 1437      PT LONG TERM GOAL #1   Title demonstrate negative positional testing   Status New   Target Date 01/15/17     PT LONG TERM GOAL #2   Title independent with HEP   Status New   Target Date 01/15/17     PT LONG TERM GOAL #3   Title formal balance testing to follow with goals to be written PRN   Status New   Target Date 01/15/17     PT LONG TERM GOAL #4   Title n/a                Plan - 12/04/16 1434    Clinical Impression Statement Pt is a 81 y/o female who presents to OPPT for recent onset of vertigo.   Clinical findings consistent with Rt pBPPV, with possible multi canal involvement.  Pt demonstrates some some mild balance deficits which we will further address upon resolve of BPPV.  Will beneift from PT to address deficits.   History and Personal Factors relevant to plan of care: neuropathy, macular degeneration   Clinical Presentation Stable   Clinical Decision Making Low   Rehab Potential Good   PT Frequency 1x / week   PT Duration 6 weeks   PT Treatment/Interventions ADLs/Self Care Home Management;Canalith Repostioning;Neuromuscular re-education;Balance training;Therapeutic exercise;Therapeutic activities;Functional mobility  training;Stair training;Gait training;Patient/family education;Vestibular   PT Next Visit Plan reassess BPPV and tx PRN; assess balance PRN (and update goals)   Consulted and Agree with Plan of Care Patient      Patient will benefit from skilled therapeutic intervention in order to improve the following deficits and impairments:  Abnormal gait, Decreased balance, Dizziness, Impaired vision/preception, Decreased mobility  Visit Diagnosis: BPPV (benign paroxysmal positional vertigo), right - Plan: PT plan of care cert/re-cert  Dizziness and giddiness - Plan: PT plan of care cert/re-cert  Unsteadiness on feet - Plan: PT plan of care cert/re-cert      G-Codes - 96/75/91 1438    Functional Assessment Tool Used (Outpatient Only) Clinical judgement; + positional testing affecting mobility   Functional Limitation Changing and maintaining body position   Changing and Maintaining Body Position Current Status (M3846) At least 40 percent but less than 60 percent impaired, limited or restricted   Changing and Maintaining Body Position Goal Status (K5993) At least 1 percent but less than 20 percent impaired, limited or restricted       Problem List Patient Active Problem List   Diagnosis Date Noted  . Disturbance of skin sensation 04/02/2013  . Pain in limb 04/02/2013      Laureen Abrahams, PT, DPT 12/04/16 2:40 PM    [facility] [address] [place], [place], [address] Phone: [phone]   Fax:  [phone]  Name: Tiffany Mejia MRN: [id number] Date of Birth: 11-27-1935

## 2016-12-05 ENCOUNTER — Ambulatory Visit: Payer: Medicare HMO

## 2016-12-07 DIAGNOSIS — H353124 Nonexudative age-related macular degeneration, left eye, advanced atrophic with subfoveal involvement: Secondary | ICD-10-CM | POA: Diagnosis not present

## 2016-12-07 DIAGNOSIS — H353114 Nonexudative age-related macular degeneration, right eye, advanced atrophic with subfoveal involvement: Secondary | ICD-10-CM | POA: Diagnosis not present

## 2016-12-07 DIAGNOSIS — Z961 Presence of intraocular lens: Secondary | ICD-10-CM | POA: Diagnosis not present

## 2016-12-07 DIAGNOSIS — H25812 Combined forms of age-related cataract, left eye: Secondary | ICD-10-CM | POA: Diagnosis not present

## 2016-12-07 DIAGNOSIS — H26491 Other secondary cataract, right eye: Secondary | ICD-10-CM | POA: Diagnosis not present

## 2016-12-13 ENCOUNTER — Ambulatory Visit: Payer: Medicare HMO | Admitting: Rehabilitative and Restorative Service Providers"

## 2016-12-13 DIAGNOSIS — R42 Dizziness and giddiness: Secondary | ICD-10-CM

## 2016-12-13 DIAGNOSIS — H8111 Benign paroxysmal vertigo, right ear: Secondary | ICD-10-CM | POA: Diagnosis not present

## 2016-12-13 DIAGNOSIS — R2681 Unsteadiness on feet: Secondary | ICD-10-CM

## 2016-12-13 NOTE — Therapy (Signed)
[facility] [address] [place], [place], [address] Phone: [phone]   Fax:  [phone]  Physical Therapy Treatment  Patient Details  Name: Tiffany Mejia MRN: [id number] Date of Birth: 11/30/1935 Referring Provider: Dr. Maurice Small  Encounter Date: 12/13/2016      PT End of Session - 12/13/16 1202    Visit Number 2   Number of Visits 6   Date for PT Re-Evaluation 01/15/17   Authorization Type G code every 10th visit   PT Start Time 1158   PT Stop Time 1238   PT Time Calculation (min) 40 min   Activity Tolerance Patient tolerated treatment well   Behavior During Therapy [facility] for tasks assessed/performed      Past Medical History:  Diagnosis Date  . Angiokeratoma   . Cervical spondylosis without myelopathy   . Depression   . GERD (gastroesophageal reflux disease)   . History of basal cell carcinoma   . HLD (hyperlipidemia)   . Hypertension   . IBS (irritable bowel syndrome)   . Lichen sclerosus of female genitalia   . Macular degeneration   . Nocturnal leg cramps   . Vertigo   . Vitamin B12 deficiency     Past Surgical History:  Procedure Laterality Date  . APPENDECTOMY    . CATARACT EXTRACTION Right   . CERVICAL SPINE SURGERY    . gallbladder resection    . TONSILLECTOMY      There were no vitals filed for this visit.      Subjective Assessment - 12/13/16 1204    Subjective The patient is still experiencing intermittent spells of vertigo.   Pertinent History MD orders: HEP only 1-3 visits, flexion based exercise.  chronic LBP and Lt LE radiculopathy, per pt report Lt foot is always numb - using Gabapentin to help with these symptoms & Meloxicam for inflammation.    How long can you stand comfortably? pain with cooking after 1 hour   Diagnostic tests MRI: January 2017.  Multilevel facet hypertrophy and mild DDD.  NCV in 2015 showed chronic L5 radiculpathy   Patient Stated Goals resolve  dizziness   Currently in Pain? No/denies                Vestibular Assessment - 12/13/16 1205      Positional Testing   Dix-Hallpike Dix-Hallpike Right;Dix-Hallpike Left   Sidelying Test Sidelying Right;Sidelying Left   Horizontal Canal Testing Horizontal Canal Right;Horizontal Canal Left     Dix-Hallpike Right   Dix-Hallpike Right Duration *performed as first position with Epley's and noted 8 seconds of nystagmus.   Dix-Hallpike Right Symptoms Upbeat, right rotatory nystagmus     Sidelying Right   Sidelying Right Duration Intense episode of "feels funny" with nystagmus in room light x 8 seconds.   Sidelying Right Symptoms Upbeat, right rotatory nystagmus     Sidelying Left   Sidelying Left Duration none   Sidelying Left Symptoms No nystagmus                 [facility] Adult PT Treatment/Exercise - 12/13/16 1542      Neuro Re-ed    Neuro Re-ed Details  Corner balance activities working on multi-sensory impairments.  Narrowing base of support adding eyes open/eyes closed.         Vestibular Treatment/Exercise - 12/13/16 1215      Vestibular Treatment/Exercise   Vestibular Treatment Provided Habituation;Canalith Repositioning   Canalith Repositioning Epley Manuever Right   Habituation Exercises  Laruth Bouchard Daroff      EPLEY MANUEVER RIGHT   Number of Reps  2   Overall Response Symptoms Resolved   Response Details  2nd rep of dix hallpike did not elicit nystagmus     Nestor Lewandowsky   Number of Reps  3   Symptom Description  Has a trace of dizziness/nystagmus to the right side only.     *In addition, patient inquired about exercise for foot "catching".  PT demonstrated rolling foot on tennis ball to maintain range of motion and stretch to tolerance.          PT Education - 12/13/16 1236    Education provided Yes   Education Details HEP:  brandt daroff and corner balance exercise   Person(s) Educated Patient   Methods Explanation;Demonstration;Handout    Comprehension Verbalized understanding;Returned demonstration             PT Long Term Goals - 12/04/16 1437      PT LONG TERM GOAL #1   Title demonstrate negative positional testing   Status New   Target Date 01/15/17     PT LONG TERM GOAL #2   Title independent with HEP   Status New   Target Date 01/15/17     PT LONG TERM GOAL #3   Title formal balance testing to follow with goals to be written PRN   Status New   Target Date 01/15/17     PT LONG TERM GOAL #4   Title n/a               Plan - 12/13/16 1544    Clinical Impression Statement The patient continued with BPPV today.  PT educated in habituation for home and began home program for balance.  PT to f/u and assess balance further as needed.     PT Treatment/Interventions ADLs/Self Care Home Management;Canalith Repostioning;Neuromuscular re-education;Balance training;Therapeutic exercise;Therapeutic activities;Functional mobility training;Stair training;Gait training;Patient/family education;Vestibular   PT Next Visit Plan positional testing, balance testing? add goals   Consulted and Agree with Plan of Care Patient      Patient will benefit from skilled therapeutic intervention in order to improve the following deficits and impairments:  Abnormal gait, Decreased balance, Dizziness, Impaired vision/preception, Decreased mobility  Visit Diagnosis: BPPV (benign paroxysmal positional vertigo), right  Dizziness and giddiness  Unsteadiness on feet     Problem List Patient Active Problem List   Diagnosis Date Noted  . Disturbance of skin sensation 04/02/2013  . Pain in limb 04/02/2013    Leomar Westberg, PT 12/13/2016, 3:45 PM  [facility] [address] [place], [place], [address] Phone: [phone]   Fax:  [phone]  Name: Tiffany Mejia MRN: [id number] Date of Birth: 07-12-1935

## 2016-12-13 NOTE — Patient Instructions (Signed)
Habituation - Tip Card  1.The goal of habituation training is to assist in decreasing symptoms of vertigo, dizziness, or nausea provoked by specific head and body motions. 2.These exercises may initially increase symptoms; however, be persistent and work through symptoms. With repetition and time, the exercises will assist in reducing or eliminating symptoms. 3.Exercises should be stopped and discussed with the therapist if you experience any of the following: - Sudden change or fluctuation in hearing - New onset of ringing in the ears, or increase in current intensity - Any fluid discharge from the ear - Severe pain in neck or back - Extreme nausea  Copyright  VHI. All rights reserved.   BEGIN ON Friday October 12 IF ANY DIZZINESS REMAINS.  ONLY DO THIS EXERCISE WHEN YOU HAVE DIZZINESS.  Habituation - Sit to Side-Lying   Sit on edge of bed. Lie down onto the right side and hold until dizziness stops, plus 20 seconds.  Return to sitting and wait until dizziness stops, plus 20 seconds.  Repeat to the left side. Repeat sequence 5 times per session. Do 2 sessions per day.  Copyright  VHI. All rights reserved.    BEGIN THIS EXERCISE Thursday October, 11  Feet Together, Varied Arm Positions - Eyes Closed    Stand with feet together and arms out. Close eyes and visualize upright position. Hold __30__ seconds. Repeat __3__ times per session. Do __2__ sessions per day.  Copyright  VHI. All rights reserved.

## 2016-12-20 ENCOUNTER — Ambulatory Visit: Payer: Medicare HMO | Admitting: Rehabilitative and Restorative Service Providers"

## 2016-12-20 DIAGNOSIS — R2681 Unsteadiness on feet: Secondary | ICD-10-CM

## 2016-12-20 DIAGNOSIS — R42 Dizziness and giddiness: Secondary | ICD-10-CM

## 2016-12-20 DIAGNOSIS — H8111 Benign paroxysmal vertigo, right ear: Secondary | ICD-10-CM | POA: Diagnosis not present

## 2016-12-20 NOTE — Therapy (Signed)
[facility] [address] [place], [place], [address] Phone: [phone]   Fax:  [phone]  Physical Therapy Treatment  Patient Details  Name: Tiffany Mejia MRN: [id number] Date of Birth: 08-Sep-1935 Referring Provider: Dr. Maurice Small  Encounter Date: 12/20/2016      PT End of Session - 12/20/16 1144    Visit Number 3   Number of Visits 6   Date for PT Re-Evaluation 01/15/17   Authorization Type G code every 10th visit   PT Start Time 1145   PT Stop Time 1225   PT Time Calculation (min) 40 min   Activity Tolerance Patient tolerated treatment well   Behavior During Therapy [facility] for tasks assessed/performed      Past Medical History:  Diagnosis Date  . Angiokeratoma   . Cervical spondylosis without myelopathy   . Depression   . GERD (gastroesophageal reflux disease)   . History of basal cell carcinoma   . HLD (hyperlipidemia)   . Hypertension   . IBS (irritable bowel syndrome)   . Lichen sclerosus of female genitalia   . Macular degeneration   . Nocturnal leg cramps   . Vertigo   . Vitamin B12 deficiency     Past Surgical History:  Procedure Laterality Date  . APPENDECTOMY    . CATARACT EXTRACTION Right   . CERVICAL SPINE SURGERY    . gallbladder resection    . TONSILLECTOMY      There were no vitals filed for this visit.      Subjective Assessment - 12/20/16 1145    Subjective The patient notes the day after she was here last time, her symptoms of vertigo were worse.  Then symptoms improved.  She notes she was without power and electricity.  She took Gabapentin and feels like this made her feel worse today (notes her head is "more off", worsening weaving with gait, needing to hold grab bars).  She notes "I'm beginning to feel unsafe and vulnerable."     Pertinent History MD orders: HEP only 1-3 visits, flexion based exercise.  chronic LBP and Lt LE radiculopathy, per pt report Lt foot is always  numb - using Gabapentin to help with these symptoms & Meloxicam for inflammation.    Diagnostic tests MRI: January 2017.  Multilevel facet hypertrophy and mild DDD.  NCV in 2015 showed chronic L5 radiculpathy   Patient Stated Goals resolve dizziness   Currently in Pain? No/denies            [facility] PT Assessment - 12/20/16 1205      Standardized Balance Assessment   Standardized Balance Assessment Berg Balance Test;Dynamic Gait Index     Berg Balance Test   Sit to Stand Able to stand without using hands and stabilize independently   Standing Unsupported Able to stand safely 2 minutes   Sitting with Back Unsupported but Feet Supported on Floor or Stool Able to sit safely and securely 2 minutes   Stand to Sit Sits safely with minimal use of hands   Transfers Able to transfer safely, minor use of hands   Standing Unsupported with Eyes Closed Able to stand 3 seconds   Standing Ubsupported with Feet Together Able to place feet together independently and stand 1 minute safely   From Standing, Reach Forward with Outstretched Arm Can reach confidently >25 cm (10")   From Standing Position, Pick up Object from Floor Able to pick up shoe, needs supervision   From Standing Position, Turn  to Look Behind Over each Shoulder Looks behind from both sides and weight shifts well   Turn 360 Degrees Able to turn 360 degrees safely but slowly   Standing Unsupported, Alternately Place Feet on Step/Stool Able to stand independently and safely and complete 8 steps in 20 seconds   Standing Unsupported, One Foot in Front Able to take small step independently and hold 30 seconds   Standing on One Leg Able to lift leg independently and hold equal to or more than 3 seconds   Total Score 47   Berg comment: 47/56     Dynamic Gait Index   Level Surface Normal   Change in Gait Speed Mild Impairment   Gait with Horizontal Head Turns Mild Impairment   Gait with Vertical Head Turns Mild Impairment   Gait and Pivot  Turn Mild Impairment   Step Over Obstacle Mild Impairment   Step Around Obstacles Mild Impairment   Steps Mild Impairment   Total Score 17   DGI comment: 17/24 (<19/24 indicates risk for falls)            Vestibular Assessment - 12/20/16 1151      Vestibular Assessment   General Observation She notes feeling worse today and feels this may be correlated to taking Gabapentin.  The patient notes she has only done HEP 1x for habituation because she wasn't feeling symptoms with it.      Positional Testing   Dix-Hallpike Dix-Hallpike Right;Dix-Hallpike Left   Sidelying Test Sidelying Right;Sidelying Left   Horizontal Canal Testing Horizontal Canal Right;Horizontal Canal Left     Dix-Hallpike Right   Dix-Hallpike Right Duration 0   Dix-Hallpike Right Symptoms No nystagmus     Dix-Hallpike Left   Dix-Hallpike Left Duration 0   Dix-Hallpike Left Symptoms No nystagmus     Sidelying Right   Sidelying Right Duration no dizziness or nystagmus   Sidelying Right Symptoms No nystagmus     Sidelying Left   Sidelying Left Duration no diziness   Sidelying Left Symptoms No nystagmus     Horizontal Canal Right   Horizontal Canal Right Duration 0   Horizontal Canal Right Symptoms Normal     Horizontal Canal Left   Horizontal Canal Left Duration 0   Horizontal Canal Left Symptoms Normal                      Balance Exercises - 12/20/16 1241      [place] PROGRAM   Head Movements Standing;5 reps   Knee Flexor 10 reps  2 lbs   Hip ABductor 10 reps  2 lbs   Ankle Plantorflexors 20 reps, no support   Ankle Dorsiflexors 20 reps, no support   Knee Bends 10 reps, no support   Backwards Walking No support   Walking and Turning Around --  too challenging, did not provide for home   Sideways Walking No assistive device   Tandem Walk Support   One Leg Stand 10 seconds, no support   Heel Walking --  too challenging for home   Sit to Stand 10 reps, no support   Stair  Walking --  2 times           PT Education - 12/20/16 1248    Education provided Yes   Education Details HEP: [place] home exercise program given _per note   Person(s) Educated Patient   Methods Explanation;Demonstration;Handout   Comprehension Verbalized understanding;Returned demonstration  PT Long Term Goals - 12/20/16 1248      PT LONG TERM GOAL #1   Title demonstrate negative positional testing   Baseline Met on 12/20/16   Status Achieved     PT LONG TERM GOAL #2   Title independent with HEP   Status On-going     PT LONG TERM GOAL #3   Title formal balance testing to follow with goals to be written PRN   Baseline Berg=47/56, DGI=17/24   Status Achieved     PT LONG TERM GOAL #4   Title The patient will imrpove DGI from 17/24 up to 19/24 to demo dec'ing risk for falls.   Status New   Target Date 01/15/17               Plan - 12/20/16 1250    Clinical Impression Statement The patient was negative today for positional testing, however still notes general multi-sensory balance impairments.  PT assessed balance with Berg and DGI and prescribed HEP.  Patient limited by transportation for more regular visits.  PT to address via HEP and f/u in 2-3 weeks.    PT Treatment/Interventions ADLs/Self Care Home Management;Canalith Repostioning;Neuromuscular re-education;Balance training;Therapeutic exercise;Therapeutic activities;Functional mobility training;Stair training;Gait training;Patient/family education;Vestibular   PT Next Visit Plan F/u in 2-3 weeks, positional testing if needed, recheck [place] and Agree with Plan of Care Patient      Patient will benefit from skilled therapeutic intervention in order to improve the following deficits and impairments:  Abnormal gait, Decreased balance, Dizziness, Impaired vision/preception, Decreased mobility  Visit Diagnosis: BPPV (benign paroxysmal positional vertigo), right  Dizziness and  giddiness  Unsteadiness on feet     Problem List Patient Active Problem List   Diagnosis Date Noted  . Disturbance of skin sensation 04/02/2013  . Pain in limb 04/02/2013    Rosellen Lichtenberger, PT 12/20/2016, 12:51 PM  [facility] [address] [place], [place], [address] Phone: [phone]   Fax:  [phone]  Name: Tiffany Mejia MRN: [id number] Date of Birth: [birth year]

## 2016-12-22 DIAGNOSIS — H353133 Nonexudative age-related macular degeneration, bilateral, advanced atrophic without subfoveal involvement: Secondary | ICD-10-CM | POA: Diagnosis not present

## 2016-12-22 DIAGNOSIS — H5201 Hypermetropia, right eye: Secondary | ICD-10-CM | POA: Diagnosis not present

## 2016-12-22 DIAGNOSIS — H5212 Myopia, left eye: Secondary | ICD-10-CM | POA: Diagnosis not present

## 2017-01-03 ENCOUNTER — Ambulatory Visit: Payer: Medicare HMO | Admitting: Rehabilitative and Restorative Service Providers"

## 2017-01-03 DIAGNOSIS — R42 Dizziness and giddiness: Secondary | ICD-10-CM

## 2017-01-03 DIAGNOSIS — R2681 Unsteadiness on feet: Secondary | ICD-10-CM

## 2017-01-03 DIAGNOSIS — H8111 Benign paroxysmal vertigo, right ear: Secondary | ICD-10-CM | POA: Diagnosis not present

## 2017-01-03 NOTE — Patient Instructions (Signed)
NECK: Rotation    LYING DOWN WITH ONE PILLOW.  Rotate head to look over shoulder. _5__ reps per set, _2__ sets per day.    Copyright  VHI. All rights reserved.   Extensors, Supine    Lie supine, head on small, rolled towel or one pillow.  Gently tuck chin and bring toward chest. Hold __5_ seconds. Repeat __10_ times per session. Do _2__ sessions per day.  Copyright  VHI. All rights reserved.

## 2017-01-03 NOTE — Therapy (Signed)
[facility] [address] [place], [place], [address] Phone: [phone]   Fax:  [phone]  Physical Therapy Treatment and Discharge Summary  Patient Details  Name: Tiffany Mejia MRN: [id number] Date of Birth: Mar 04, 1936 Referring Provider: Dr. Maurice Small  Encounter Date: 01/03/2017      PT End of Session - 01/03/17 1325    Visit Number 4   Number of Visits 6   Date for PT Re-Evaluation 01/15/17   Authorization Type G code every 10th visit   PT Start Time 1324   PT Stop Time 1358   PT Time Calculation (min) 34 min   Activity Tolerance Patient tolerated treatment well   Behavior During Therapy [facility] for tasks assessed/performed      Past Medical History:  Diagnosis Date  . Angiokeratoma   . Cervical spondylosis without myelopathy   . Depression   . GERD (gastroesophageal reflux disease)   . History of basal cell carcinoma   . HLD (hyperlipidemia)   . Hypertension   . IBS (irritable bowel syndrome)   . Lichen sclerosus of female genitalia   . Macular degeneration   . Nocturnal leg cramps   . Vertigo   . Vitamin B12 deficiency     Past Surgical History:  Procedure Laterality Date  . APPENDECTOMY    . CATARACT EXTRACTION Right   . CERVICAL SPINE SURGERY    . gallbladder resection    . TONSILLECTOMY      There were no vitals filed for this visit.      Subjective Assessment - 01/03/17 1326    Subjective She notes positional vertigo is improved, but she has general swimmyheadedness and peripheral neuropathy that she feels are contributing to dec'd confidence.  She did [place] home program and felt they are easy and she also dances in home for exercise.    Pertinent History MD orders: HEP only 1-3 visits, flexion based exercise.  chronic LBP and Lt LE radiculopathy, per pt report Lt foot is always numb - using Gabapentin to help with these symptoms & Meloxicam for inflammation.    Patient Stated Goals  resolve dizziness   Currently in Pain? No/denies                         [facility] Adult PT Treatment/Exercise - 01/03/17 1409      Self-Care   Self-Care Other Self-Care Comments   Other Self-Care Comments  PT and patient discussed:  1) exercising with television AHOY class @ 8am or 1pm on community channel for more challenging exercise as she cannot attend community center due to transportation limitations b/c of vision 2) progressing [place] HEP using increasing ankle weights 3) magnifyers and stylus pen for use of technology as she notes limitations b/c of difficulty with fingers pressing keyboard letters     Exercises   Exercises Neck     Neck Exercises: Supine   Neck Retraction 5 reps;5 secs   Cervical Rotation 5 reps;Both                PT Education - 01/03/17 1407    Education provided Yes   Education Details HEP:  removed standing neck rotation and added supine neck rotation and chin tuck   Person(s) Educated Patient   Methods Explanation;Demonstration;Handout   Comprehension Verbalized understanding;Returned demonstration             PT Long Term Goals - 01/03/17 1412  PT LONG TERM GOAL #1   Title demonstrate negative positional testing   Baseline Met on 12/20/16   Status Achieved     PT LONG TERM GOAL #2   Title independent with HEP   Baseline Met on 22-Jan-2017   Status Achieved     PT LONG TERM GOAL #3   Title formal balance testing to follow with goals to be written PRN   Baseline Berg=47/56, DGI=17/24   Status Achieved     PT LONG TERM GOAL #4   Title The patient will imrpove DGI from 17/24 up to 19/24 to demo dec'ing risk for falls.   Baseline did not retest   Status Deferred               Plan - 01/22/2017 1413    Clinical Impression Statement The patient met 3/3 LTGs assessed today.  She notes resolution of spinning sensation, but continues with general unsteadiness at times.  She plans to continue with HEP and  community options for increasing balance challenge.    PT Treatment/Interventions ADLs/Self Care Home Management;Canalith Repostioning;Neuromuscular re-education;Balance training;Therapeutic exercise;Therapeutic activities;Functional mobility training;Stair training;Gait training;Patient/family education;Vestibular   PT Next Visit Plan discharge today.   Consulted and Agree with Plan of Care Patient      Patient will benefit from skilled therapeutic intervention in order to improve the following deficits and impairments:  Abnormal gait, Decreased balance, Dizziness, Impaired vision/preception, Decreased mobility  Visit Diagnosis: Dizziness and giddiness  Unsteadiness on feet       G-Codes - 2017-01-22 1415    Functional Assessment Tool Used (Outpatient Only) negative positional testing   Functional Limitation Changing and maintaining body position   Changing and Maintaining Body Position Goal Status (Z9672) At least 1 percent but less than 20 percent impaired, limited or restricted   Changing and Maintaining Body Position Discharge Status (W9791) At least 1 percent but less than 20 percent impaired, limited or restricted      Problem List Patient Active Problem List   Diagnosis Date Noted  . Disturbance of skin sensation 04/02/2013  . Pain in limb 04/02/2013    Amauri Keefe, PT Jan 22, 2017, 2:16 PM  [facility] [address] [place], [place], [address] Phone: [phone]   Fax:  [phone]  Name: Tiffany Mejia MRN: [id number] Date of Birth: [birth year]

## 2017-01-05 IMAGING — MR MR LUMBAR SPINE W/O CM
5 series · 42 of 48 positions shown · non-contrast
Comparison: Abdominal CT 05/06/2009.  Lumbar MRI 03/13/2004.

CLINICAL DATA: Predominately left-sided low back pain extending
into the groin with weakness in the left foot and numbness in both
feet. Chronic symptoms worsening over the last 3 months. No acute
injury or prior relevant surgery.

EXAM:
MRI LUMBAR SPINE WITHOUT CONTRAST
TECHNIQUE: Multiplanar, multisequence MR imaging of the lumbar spine was
performed. No intravenous contrast was administered.

[Series 3: tirm sag · sagittal · 4.0mm · 0.55mm/px · 6 of 13 slices shown]
[im 1/13]
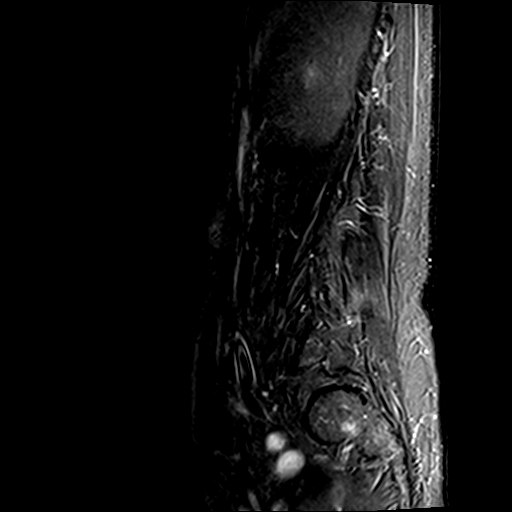
[im 3/13]
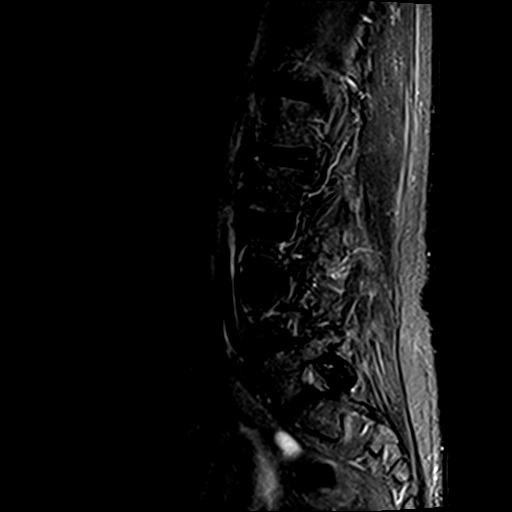
[im 5/13]
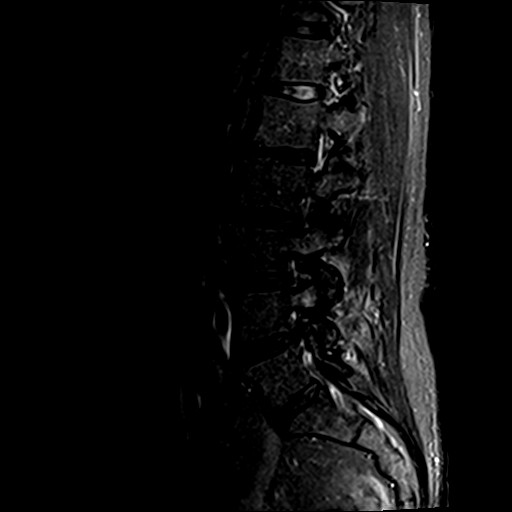
[im 8/13]
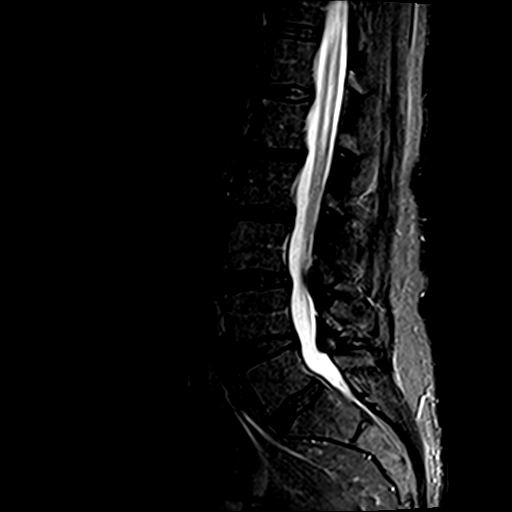
[im 10/13]
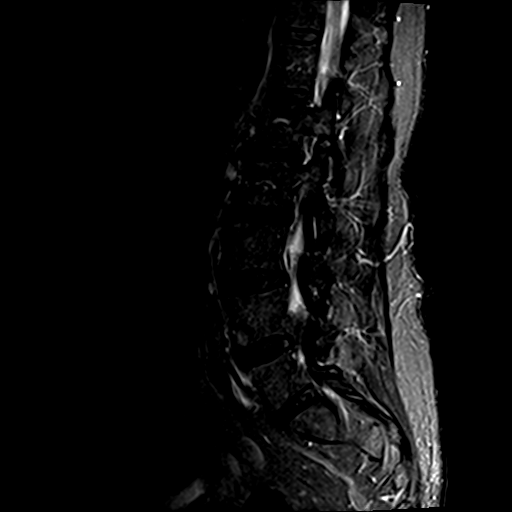
[im 13/13]
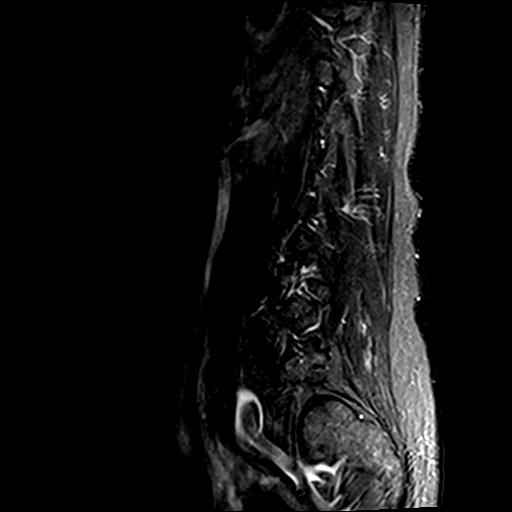

[Series 4: T2 · sagittal · 4.0mm · 0.88mm/px · 6 of 13 slices shown (1 of 2)]
[im 1/13]
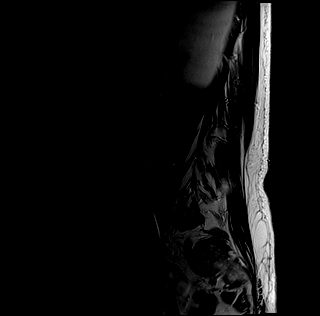
[im 3/13]
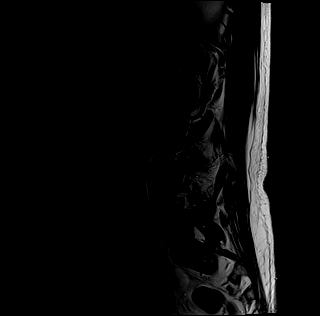
[im 5/13]
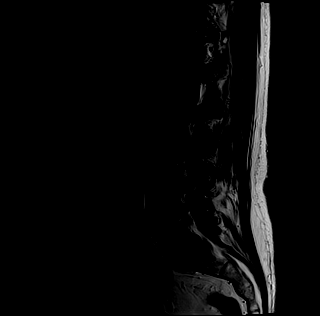
[im 8/13]
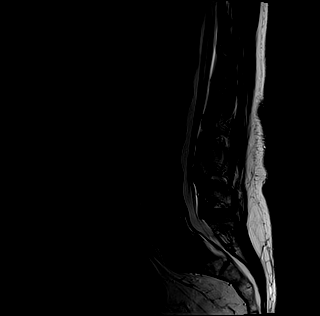
[im 10/13]
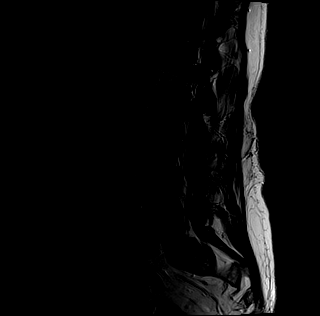
[im 13/13]
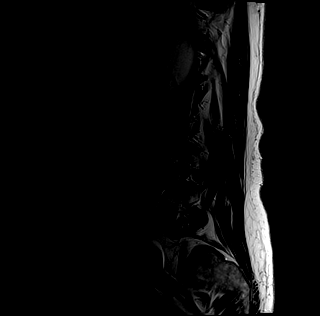

[Series 5: T1 · sagittal · 4.0mm · 0.88mm/px · 6 of 13 slices shown (1 of 2)]
[im 1/13]
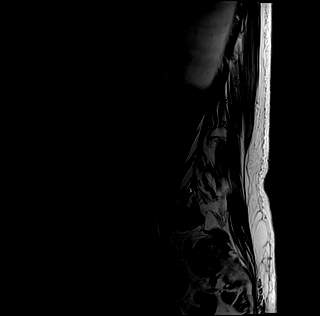
[im 3/13]
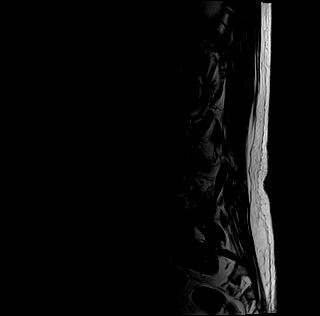
[im 5/13]
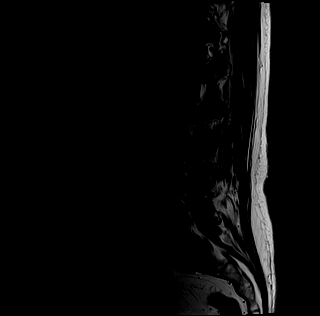
[im 8/13]
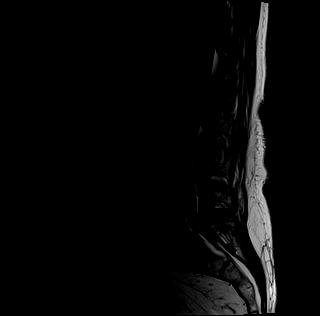
[im 10/13]
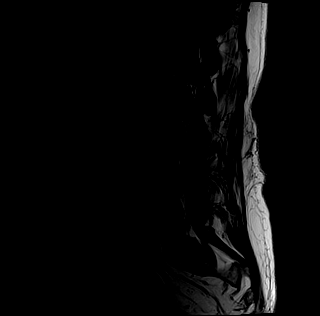
[im 13/13]
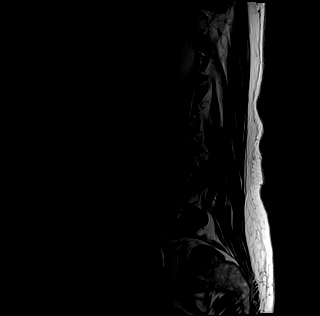

[Series 6: T1 · axial · 4.0mm · 0.70mm/px · z∈[-84,+90]mm · 9 of 31 slices shown (2 of 2)]
[im 1/31]
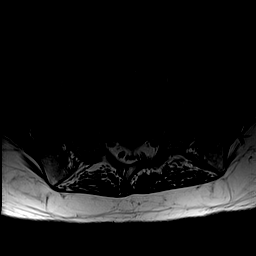
[im 5/31]
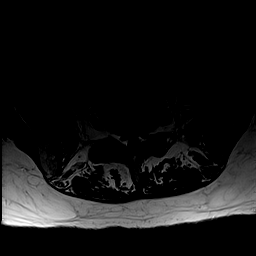
[im 9/31]
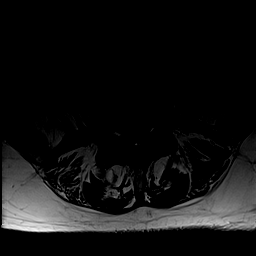
[im 13/31]
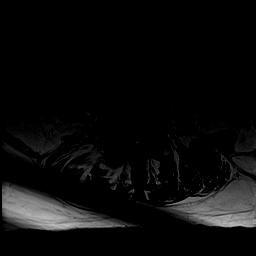
[im 16/31]
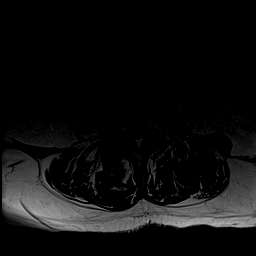
[im 18/31]
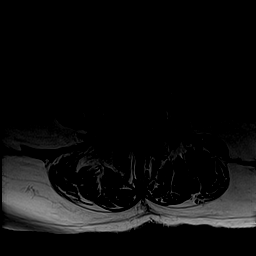
[im 22/31]
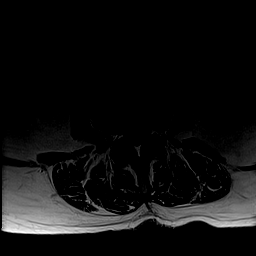
[im 26/31]
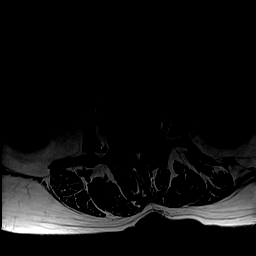
[im 31/31]
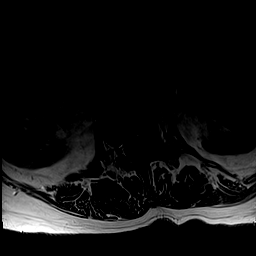

[Series 7: T2 · axial · 4.0mm · 0.70mm/px · z∈[-84,+90]mm · 15 of 31 slices shown (2 of 2)]
[im 1/31]
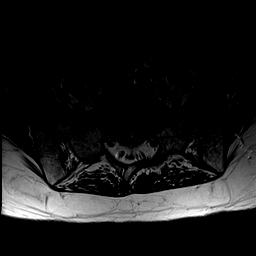
[im 3/31]
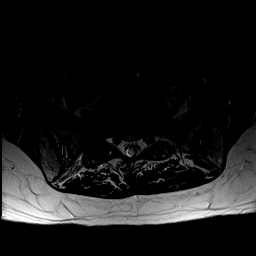
[im 5/31]
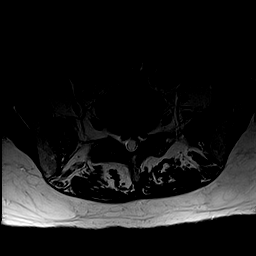
[im 7/31]
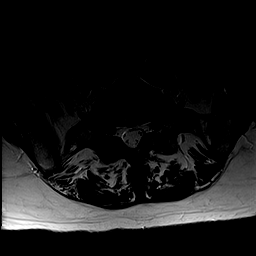
[im 9/31]
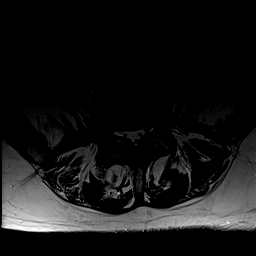
[im 11/31]
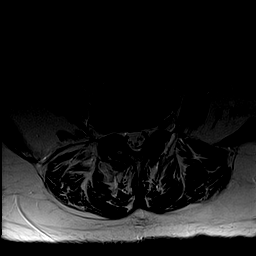
[im 13/31]
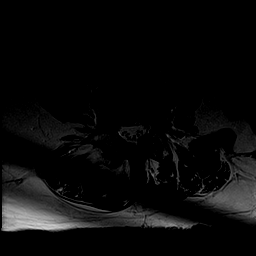
[im 16/31]
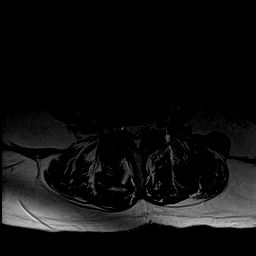
[im 18/31]
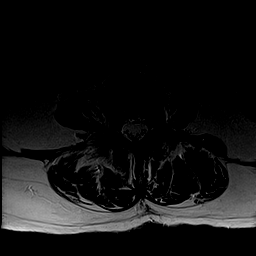
[im 20/31]
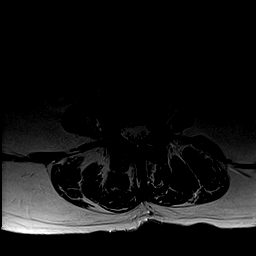
[im 22/31]
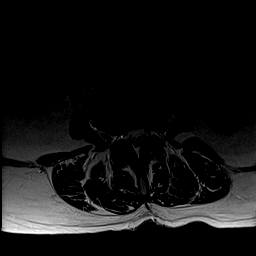
[im 24/31]
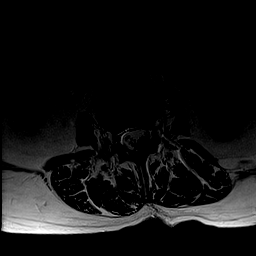
[im 26/31]
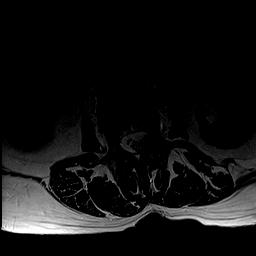
[im 28/31]
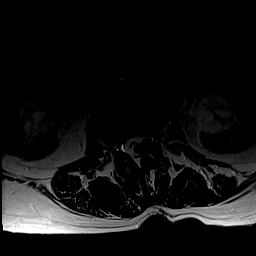
[im 31/31]
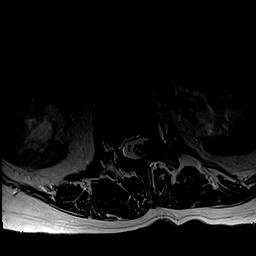

[42 of 48 positions shown; findings below may reference images not displayed]

FINDINGS: Segmentation: Conventional anatomy assumed, with the last open disc
space designated L5-S1.

Alignment: The overall alignment is similar to the prior exam. There
is a mild convex right scoliosis with a degenerative grade 1
anterolisthesis at L3-4.

Bones: No worrisome osseous lesion, acute fracture or pars defect.
Small L1 hemangioma noted.

Conus medullaris: Extends to the L1 level and appears normal. The
nerve roots are displaced into the left aspect of the upper lumbar
spinal canal, attributed to the patient's scoliosis. There is no
definite nerve root clumping.

Paraspinal and other soft tissues: No significant paraspinal
findings. Bilateral renal parapelvic cysts are noted.

Disc levels:

L1-2: Chronic loss of disc height with annular disc bulging and
facet hypertrophy asymmetric to the left. No spinal stenosis or
nerve root encroachment.

L2-3: Mildly progressive annular disc bulging, facet and ligamentous
hypertrophy. No significant spinal stenosis or nerve root
encroachment.

L3-4: There is stable annular disc bulging with mildly progressive
facet and ligamentous hypertrophy accounting for the grade 1
anterolisthesis. There is mildly progressive spinal stenosis, still
mild to moderate in degree. The foramina are sufficiently patent. No
evidence of nerve root encroachment.

L4-5: Mildly progressive annular disc bulging, facet and ligamentous
hypertrophy. No significant spinal stenosis or nerve root
encroachment.

L5-S1: As numbered, this disc space appears somewhat transitional.
Disc height and hydration are maintained. Mild bilateral facet
hypertrophy. No spinal stenosis or nerve root encroachment.
IMPRESSION: 1. Since the prior study from 2661, there is mildly progressive disc
bulging and facet disease from L2-3 through L4-5.
2. At L3-4, there is a grade 1 anterolisthesis contributing to
mildly progressive spinal stenosis, still mild to moderate in
degree.
3. The foramina and lateral recesses appear adequately patent at all
levels. No acute findings or nerve root encroachment identified.

## 2017-01-18 DIAGNOSIS — R399 Unspecified symptoms and signs involving the genitourinary system: Secondary | ICD-10-CM | POA: Diagnosis not present

## 2017-01-19 DIAGNOSIS — B356 Tinea cruris: Secondary | ICD-10-CM | POA: Diagnosis not present

## 2017-03-21 DIAGNOSIS — H01114 Allergic dermatitis of left upper eyelid: Secondary | ICD-10-CM | POA: Diagnosis not present

## 2017-05-29 DIAGNOSIS — M4800 Spinal stenosis, site unspecified: Secondary | ICD-10-CM | POA: Diagnosis not present

## 2017-05-29 DIAGNOSIS — E559 Vitamin D deficiency, unspecified: Secondary | ICD-10-CM | POA: Diagnosis not present

## 2017-05-29 DIAGNOSIS — E538 Deficiency of other specified B group vitamins: Secondary | ICD-10-CM | POA: Diagnosis not present

## 2017-05-29 DIAGNOSIS — Z136 Encounter for screening for cardiovascular disorders: Secondary | ICD-10-CM | POA: Diagnosis not present

## 2017-05-29 DIAGNOSIS — I10 Essential (primary) hypertension: Secondary | ICD-10-CM | POA: Diagnosis not present

## 2017-05-29 DIAGNOSIS — R739 Hyperglycemia, unspecified: Secondary | ICD-10-CM | POA: Diagnosis not present

## 2017-05-29 DIAGNOSIS — R5382 Chronic fatigue, unspecified: Secondary | ICD-10-CM | POA: Diagnosis not present

## 2017-05-29 DIAGNOSIS — E785 Hyperlipidemia, unspecified: Secondary | ICD-10-CM | POA: Diagnosis not present

## 2017-05-29 DIAGNOSIS — Z0000 Encounter for general adult medical examination without abnormal findings: Secondary | ICD-10-CM | POA: Diagnosis not present

## 2017-05-30 ENCOUNTER — Encounter: Payer: Self-pay | Admitting: Neurology

## 2017-05-31 DIAGNOSIS — M545 Low back pain: Secondary | ICD-10-CM | POA: Diagnosis not present

## 2017-05-31 DIAGNOSIS — R2689 Other abnormalities of gait and mobility: Secondary | ICD-10-CM | POA: Diagnosis not present

## 2017-05-31 DIAGNOSIS — M5416 Radiculopathy, lumbar region: Secondary | ICD-10-CM | POA: Diagnosis not present

## 2017-06-04 DIAGNOSIS — M5416 Radiculopathy, lumbar region: Secondary | ICD-10-CM | POA: Diagnosis not present

## 2017-06-04 DIAGNOSIS — R2689 Other abnormalities of gait and mobility: Secondary | ICD-10-CM | POA: Diagnosis not present

## 2017-06-04 DIAGNOSIS — M545 Low back pain: Secondary | ICD-10-CM | POA: Diagnosis not present

## 2017-06-06 DIAGNOSIS — M545 Low back pain: Secondary | ICD-10-CM | POA: Diagnosis not present

## 2017-06-06 DIAGNOSIS — R2689 Other abnormalities of gait and mobility: Secondary | ICD-10-CM | POA: Diagnosis not present

## 2017-06-06 DIAGNOSIS — M5416 Radiculopathy, lumbar region: Secondary | ICD-10-CM | POA: Diagnosis not present

## 2017-06-11 DIAGNOSIS — M545 Low back pain: Secondary | ICD-10-CM | POA: Diagnosis not present

## 2017-06-11 DIAGNOSIS — R2689 Other abnormalities of gait and mobility: Secondary | ICD-10-CM | POA: Diagnosis not present

## 2017-06-11 DIAGNOSIS — M5416 Radiculopathy, lumbar region: Secondary | ICD-10-CM | POA: Diagnosis not present

## 2017-06-13 DIAGNOSIS — R2689 Other abnormalities of gait and mobility: Secondary | ICD-10-CM | POA: Diagnosis not present

## 2017-06-13 DIAGNOSIS — M5416 Radiculopathy, lumbar region: Secondary | ICD-10-CM | POA: Diagnosis not present

## 2017-06-13 DIAGNOSIS — M545 Low back pain: Secondary | ICD-10-CM | POA: Diagnosis not present

## 2017-06-19 DIAGNOSIS — M545 Low back pain: Secondary | ICD-10-CM | POA: Diagnosis not present

## 2017-06-19 DIAGNOSIS — M5416 Radiculopathy, lumbar region: Secondary | ICD-10-CM | POA: Diagnosis not present

## 2017-06-19 DIAGNOSIS — R2689 Other abnormalities of gait and mobility: Secondary | ICD-10-CM | POA: Diagnosis not present

## 2017-06-21 DIAGNOSIS — M5416 Radiculopathy, lumbar region: Secondary | ICD-10-CM | POA: Diagnosis not present

## 2017-06-21 DIAGNOSIS — R2689 Other abnormalities of gait and mobility: Secondary | ICD-10-CM | POA: Diagnosis not present

## 2017-06-21 DIAGNOSIS — M545 Low back pain: Secondary | ICD-10-CM | POA: Diagnosis not present

## 2017-06-25 DIAGNOSIS — R2689 Other abnormalities of gait and mobility: Secondary | ICD-10-CM | POA: Diagnosis not present

## 2017-06-25 DIAGNOSIS — M545 Low back pain: Secondary | ICD-10-CM | POA: Diagnosis not present

## 2017-06-25 DIAGNOSIS — M5416 Radiculopathy, lumbar region: Secondary | ICD-10-CM | POA: Diagnosis not present

## 2017-07-09 DIAGNOSIS — R32 Unspecified urinary incontinence: Secondary | ICD-10-CM | POA: Diagnosis not present

## 2017-07-09 DIAGNOSIS — N393 Stress incontinence (female) (male): Secondary | ICD-10-CM | POA: Diagnosis not present

## 2017-07-09 DIAGNOSIS — N3941 Urge incontinence: Secondary | ICD-10-CM | POA: Diagnosis not present

## 2017-07-09 DIAGNOSIS — N949 Unspecified condition associated with female genital organs and menstrual cycle: Secondary | ICD-10-CM | POA: Diagnosis not present

## 2017-08-15 DIAGNOSIS — K136 Irritative hyperplasia of oral mucosa: Secondary | ICD-10-CM | POA: Diagnosis not present

## 2017-08-17 DIAGNOSIS — K136 Irritative hyperplasia of oral mucosa: Secondary | ICD-10-CM | POA: Diagnosis not present

## 2017-08-20 DIAGNOSIS — R32 Unspecified urinary incontinence: Secondary | ICD-10-CM | POA: Diagnosis not present

## 2017-08-20 DIAGNOSIS — N949 Unspecified condition associated with female genital organs and menstrual cycle: Secondary | ICD-10-CM | POA: Diagnosis not present

## 2017-08-20 DIAGNOSIS — L900 Lichen sclerosus et atrophicus: Secondary | ICD-10-CM | POA: Diagnosis not present

## 2017-08-20 DIAGNOSIS — N393 Stress incontinence (female) (male): Secondary | ICD-10-CM | POA: Diagnosis not present

## 2017-08-21 ENCOUNTER — Other Ambulatory Visit: Payer: Self-pay

## 2017-08-21 NOTE — Patient Outreach (Signed)
[facility]) Care Management  08/21/2017  Tiffany Mejia [birth year] [id number]  TELEPHONE SCREENING Referral date: 08/14/17 Referral source:  Utilization management referral Referral reason: needs transportation assistance to dental provider in [facility]. Patient is legally blind Insurance: [employer] team advantage.   Telephone call to patient regarding utilization management referral. HIPAA verified with patient. Explained reason for call. Patient states she no longer needs transportation assistance. Patient states she found a dentist in [place], [place] with the help of her health team advantage. Patient states she has already had her oral surgery and is scheduled to have a follow up visit. Patient states she has done well with her oral surgery.  Patient states her son has set her up with [employer] to use if needed for transportation and the low vision occupational therapist has set her up with [facility] if needed.   RNCM discussed [facility] care management services. Patient denies any needs at this time. Patient verbally agreed to have [facility] care management brochure/ magnet mailed to her for future reference.   PLAN: RNCM will close patient due to refusal of services.  RNCM will send patient [facility] care management brochure/ magnet as discussed.  RNCM will send closure notification to patients primary MD.   Quinn Plowman RN,BSN,CCM [facility] Telephonic  [phone]

## 2017-08-29 DIAGNOSIS — K136 Irritative hyperplasia of oral mucosa: Secondary | ICD-10-CM | POA: Diagnosis not present

## 2017-09-04 DIAGNOSIS — I10 Essential (primary) hypertension: Secondary | ICD-10-CM | POA: Diagnosis not present

## 2017-09-13 DIAGNOSIS — H26491 Other secondary cataract, right eye: Secondary | ICD-10-CM | POA: Diagnosis not present

## 2017-09-13 DIAGNOSIS — H353124 Nonexudative age-related macular degeneration, left eye, advanced atrophic with subfoveal involvement: Secondary | ICD-10-CM | POA: Diagnosis not present

## 2017-09-13 DIAGNOSIS — H25812 Combined forms of age-related cataract, left eye: Secondary | ICD-10-CM | POA: Diagnosis not present

## 2017-09-13 DIAGNOSIS — H353134 Nonexudative age-related macular degeneration, bilateral, advanced atrophic with subfoveal involvement: Secondary | ICD-10-CM | POA: Diagnosis not present

## 2017-09-13 DIAGNOSIS — H353114 Nonexudative age-related macular degeneration, right eye, advanced atrophic with subfoveal involvement: Secondary | ICD-10-CM | POA: Diagnosis not present

## 2017-10-01 ENCOUNTER — Ambulatory Visit (INDEPENDENT_AMBULATORY_CARE_PROVIDER_SITE_OTHER): Payer: PPO | Admitting: Neurology

## 2017-10-01 ENCOUNTER — Encounter: Payer: Self-pay | Admitting: Neurology

## 2017-10-01 ENCOUNTER — Other Ambulatory Visit: Payer: PPO

## 2017-10-01 VITALS — BP 122/70 | HR 66 | Ht 66.0 in | Wt 149.0 lb

## 2017-10-01 DIAGNOSIS — R202 Paresthesia of skin: Secondary | ICD-10-CM

## 2017-10-01 NOTE — Patient Instructions (Addendum)
You can try magnesium oxide 400-600mg  daily for cramps  You can also try lidocaine ointment to the tips of your toes and see if that helps with burning/stabbing pain  Check labs  NCS/EMG of the legs  Start using a cane when walking    ELECTROMYOGRAM AND NERVE CONDUCTION STUDIES (EMG/NCS) INSTRUCTIONS  How to Prepare The neurologist conducting the EMG will need to know if you have certain medical conditions. Tell the neurologist and other EMG lab personnel if you: . Have a pacemaker or any other electrical medical device . Take blood-thinning medications . Have hemophilia, a blood-clotting disorder that causes prolonged bleeding Bathing Take a shower or bath shortly before your exam in order to remove oils from your skin. Don't apply lotions or creams before the exam.  What to Expect You'll likely be asked to change into a hospital gown for the procedure and lie down on an examination table. The following explanations can help you understand what will happen during the exam.  . Electrodes. The neurologist or a technician places surface electrodes at various locations on your skin depending on where you're experiencing symptoms. Or the neurologist may insert needle electrodes at different sites depending on your symptoms.  . Sensations. The electrodes will at times transmit a tiny electrical current that you may feel as a twinge or spasm. The needle electrode may cause discomfort or pain that usually ends shortly after the needle is removed. If you are concerned about discomfort or pain, you may want to talk to the neurologist about taking a short break during the exam.  . Instructions. During the needle EMG, the neurologist will assess whether there is any spontaneous electrical activity when the muscle is at rest - activity that isn't present in healthy muscle tissue - and the degree of activity when you slightly contract the muscle.  He or she will give you instructions on resting and  contracting a muscle at appropriate times. Depending on what muscles and nerves the neurologist is examining, he or she may ask you to change positions during the exam.  After your EMG You may experience some temporary, minor bruising where the needle electrode was inserted into your muscle. This bruising should fade within several days. If it persists, contact your primary care doctor.

## 2017-10-01 NOTE — Progress Notes (Signed)
[facility] Clinic Note - Initial Visit   Date: 10/01/17  Tiffany Mejia MRN: [id number] DOB: 1936/02/27   Dear Dr. Justin Mend:  Thank you for your kind referral of Tiffany Mejia for consultation of imbalance. Although her history is well known to you, please allow [place] to reiterate it for the purpose of our medical record. The patient was accompanied to the clinic by self.    History of Present Illness: Tiffany Mejia is a 82 y.o. right-handed Caucasian female with hypertension, hyperlipidemia, GERD, macular degeneration presenting for evaluation of peripheral neuropathy.    Starting around 2012, she began experiencing numbness of the feet, which is worse in the left.  She has tingling over the tips of her toes, which is worse at nighttime.  She also complains of fluttering sensation over the lower legs and cramps, as well as intermittent sharp stinging pain which is not severe.  She denies and leg weakness or falls.  She has imbalance and is careful when walking, part of this is also due to being legally blind due to severe macular degeneration. She saw Dr. Jannifer Franklin in 2015 for symptoms and NCS/EMG of the right arm and leg which showed mild carpal tunnel syndrome, no findings to support large fiber neuropathy.  MRI lumbar spine from 2017 shows mild degenerative changes, no focal nerve impingement.  She was tried on gabapentin, but unable to titrate due to sedation. Nothing that makes symptoms worse.   She admits to drinking 1-2 glasses of wine nightly for the past 15 years, sometimes more.  She recently moved into [facility] and is struggling with this transition.   Out-side paper records, electronic medical record, and images have been reviewed where available and summarized as:  Labs 05/29/2017:  HbA1c 5.6, TSH 2.77, vitamin B12 213  MRI lumbar spine wo contrast 03/30/2015: 1. Since the prior study from 2006, there is mildly progressive disc bulging and facet  disease from L2-3 through L4-5. 2. At L3-4, there is a grade 1 anterolisthesis contributing to mildly progressive spinal stenosis, still mild to moderate in degree. 3. The foramina and lateral recesses appear adequately patent at all levels. No acute findings or nerve root encroachment identified.  NCS/EMG of the right arm and bilateral legs 04/11/2013: Nerve conduction studies done on the right arm and both lower extremities does not show clear evidence of a peripheral neuropathy. There appears to be a borderline right carpal tunnel syndrome. EMG evaluation of the left lower extremity shows findings that are consistent with a mild, chronic, stable L5 radiculopathy. EMG evaluation of the right upper extremity was unremarkable, without evidence of an overlying cervical radiculopathy.  Past Medical History:  Diagnosis Date  . Angiokeratoma   . Cervical spondylosis without myelopathy   . Depression   . GERD (gastroesophageal reflux disease)   . History of basal cell carcinoma   . HLD (hyperlipidemia)   . Hypertension   . IBS (irritable bowel syndrome)   . Lichen sclerosus of female genitalia   . Macular degeneration   . Nocturnal leg cramps   . Vertigo   . Vitamin B12 deficiency     Past Surgical History:  Procedure Laterality Date  . APPENDECTOMY    . CATARACT EXTRACTION Right   . CERVICAL SPINE SURGERY    . gallbladder resection    . TONSILLECTOMY       Medications:  Outpatient Encounter Medications as of 10/01/2017  Medication Sig Note  . clobetasol ointment (TEMOVATE) 0.05 % Apply  1 application topically 2 (two) times daily. Reported on 06/02/2015   . lisinopril (PRINIVIL,ZESTRIL) 2.5 MG tablet Take 2.5 mg by mouth daily.   . Melatonin 3 MG CAPS Take 2 capsules by mouth.    . Multiple Vitamins-Minerals (VITEYES AREDS FORMULA/LUTEIN) CAPS Take 1 capsule by mouth daily.   Marland Kitchen PREMARIN vaginal cream Place 1.062 Applicatorfuls vaginally at bedtime as needed. 04/02/2013: Received from:  External Pharmacy Received Sig:   . [DISCONTINUED] DULoxetine (CYMBALTA) 30 MG capsule Take 30 mg by mouth daily. Reported on 06/02/2015   . [DISCONTINUED] DULoxetine (CYMBALTA) 30 MG capsule One tablet daily for 2 weeks, then take 1 tablet twice daily (Patient not taking: Reported on 12/04/2016)   . [DISCONTINUED] gabapentin (NEURONTIN) 100 MG capsule Take 100 mg by mouth 3 (three) times daily.   . [DISCONTINUED] meloxicam (MOBIC) 15 MG tablet Take 15 mg by mouth daily.   . [DISCONTINUED] pravastatin (PRAVACHOL) 20 MG tablet Take 20 mg by mouth daily. Reported on 06/02/2015 04/02/2013: Received from: External Pharmacy Received Sig:   . [DISCONTINUED] vitamin B-12 (CYANOCOBALAMIN) 1000 MCG tablet Take 1,000 mcg by mouth daily.   . [DISCONTINUED] Vitamin D, Ergocalciferol, (DRISDOL) 50000 UNITS CAPS capsule Take 50,000 Units by mouth every 7 (seven) days.   . [DISCONTINUED] zolpidem (AMBIEN) 10 MG tablet Take 10 mg by mouth at bedtime as needed for sleep (.5 as needed). Reported on 06/02/2015    No facility-administered encounter medications on file as of 10/01/2017.      Allergies:  Allergies  Allergen Reactions  . Nexium [Esomeprazole Magnesium] Rash    Family History: Family History  Problem Relation Age of Onset  . Heart disease Father   . Stroke Sister   . Neuropathy Sister   . Hypertension Brother   . Stroke Brother     Social History: Social History   Tobacco Use  . Smoking status: Former Smoker    Last attempt to quit: 03/07/1995    Years since quitting: 22.5  . Smokeless tobacco: Never Used  . Tobacco comment: quit Feb 1997  Substance Use Topics  . Alcohol use: Yes    Comment: occassional 2 glasses of wine nightly  . Drug use: No   Social History   Social History Narrative  . Not on file    Review of Systems:  CONSTITUTIONAL: No fevers, chills, night sweats, or weight loss.   EYES: +visual changes or eye pain ENT: No hearing changes.  No history of nose bleeds.     RESPIRATORY: No cough, wheezing and shortness of breath.   CARDIOVASCULAR: Negative for chest pain, and palpitations.   GI: Negative for abdominal discomfort, blood in stools or black stools.  No recent change in bowel habits.   GU:  No history of incontinence.   MUSCLOSKELETAL: No history of joint pain or swelling.  No myalgias.   SKIN: Negative for lesions, rash, and itching.   HEMATOLOGY/ONCOLOGY: Negative for prolonged bleeding, bruising easily, and swollen nodes.   ENDOCRINE: Negative for cold or heat intolerance, polydipsia or goiter.   PSYCH:  No depression or anxiety symptoms.   NEURO: As Above.   Vital Signs:  BP 122/70   Pulse 66   Ht 5\' 6"  (1.676 m)   Wt 149 lb (67.6 kg)   SpO2 91%   BMI 24.05 kg/m    General Medical Exam:   General:  Well appearing, comfortable.   Eyes/ENT: see cranial nerve examination.   Neck: No masses appreciated.  Full range of motion without  tenderness.  No carotid bruits. Respiratory:  Clear to auscultation, good air entry bilaterally.   Cardiac:  Regular rate and rhythm, no murmur.   Extremities:  No deformities, edema, or skin discoloration.  Skin:  No rashes or lesions.  Neurological Exam: MENTAL STATUS including orientation to time, place, person, recent and remote memory, attention span and concentration, language, and fund of knowledge is normal.  Speech is not dysarthric.  CRANIAL NERVES: II:  Able to visualize light in left eye.  Visual field intact in the right eye.  Drusen in the macula on the left.  Optic disc are flat on fundoscopic exam.   III-IV-VI: Pupils equal round and reactive to light.  Normal conjugate, extra-ocular eye movements in all directions of gaze.  No nystagmus.  No ptosis.   V:  Normal facial sensation.     VII:  Normal facial symmetry and movements.   VIII:  Normal hearing and vestibular function.   IX-X:  Normal palatal movement.   XI:  Normal shoulder shrug and head rotation.   XII:  Normal tongue strength  and range of motion, no deviation or fasciculation.  MOTOR:  No atrophy, fasciculations or abnormal movements.  No pronator drift.  Tone is normal.    Right Upper Extremity:    Left Upper Extremity:    Deltoid  5/5   Deltoid  5/5   Biceps  5/5   Biceps  5/5   Triceps  5/5   Triceps  5/5   Wrist extensors  5/5   Wrist extensors  5/5   Wrist flexors  5/5   Wrist flexors  5/5   Finger extensors  5/5   Finger extensors  5/5   Finger flexors  5/5   Finger flexors  5/5   Dorsal interossei  5/5   Dorsal interossei  5/5   Abductor pollicis  5/5   Abductor pollicis  5/5   Tone (Ashworth scale)  0  Tone (Ashworth scale)  0   Right Lower Extremity:    Left Lower Extremity:    Hip flexors  5/5   Hip flexors  5/5   Hip extensors  5/5   Hip extensors  5/5   Knee flexors  5/5   Knee flexors  5/5   Knee extensors  5/5   Knee extensors  5/5   Dorsiflexors  5/5   Dorsiflexors  5/5   Plantarflexors  5/5   Plantarflexors  5/5   Toe extensors  4/5   Toe extensors  4/5   Toe flexors  4/5   Toe flexors  4/5   Tone (Ashworth scale)  0  Tone (Ashworth scale)  0   MSRs:  Right                                                                 Left brachioradialis 2+  brachioradialis 2+  biceps 2+  biceps 2+  triceps 2+  triceps 2+  patellar 3+  patellar 3+  ankle jerk 2+  ankle jerk 2+  Hoffman no  Hoffman no  plantar response down  plantar response down   SENSORY:  Vibration reduced at the ankles bilaterally, worse on the left.  Pin prick and temperature is reduced over the dorsum of the feet.  Romberg's sign present.  COORDINATION/GAIT: Normal finger-to- nose-finger.  Intact rapid alternating movements bilaterally.  Gait mildly-wide based and stable. Stressed gait intact, unsteady with tandem gait.   IMPRESSION: Bilateral feet paresthesias most likely due to peripheral neuropathy.  Previously NCS/EMG from 2015 did not show large fiber neuropathy.  I will repeat NCS/EMG of the legs to assess symptoms  as there has been progression. If this is normal, it is possible she has small fiber neuropathy.  She endorses drinking 1-2 glasses of wine nightly, possibly more and it was explained that alcohol is the second most common cause of neuropathy. She has already started to consider alcohol cessation due to her new living situation at [facility].  She has no history of diabetes, thyroid problems, and vitamin B12 is normal.  MRI lumbar spine shows mild degenerative changes and spinal canal stenosis, no focal nerve impingement, so doubt this is causing her sensory symptoms.    PLAN/RECOMMENDATIONS:  Check MMA, vitamin B1, copper, SPEP with IFE NCS/EMG of the legs OTC lidocaine ointment to feet Start magnesium oxide 400-600mg  daily for cramps Fall precautions discussed, strongly encouraged to use a cane at all times  Further recommendations pending results  Thank you for allowing me to participate in patient's care.  If I can answer any additional questions, I would be pleased to do so.    Sincerely,    Degan Hanser K. Posey Pronto, DO

## 2017-10-04 LAB — IMMUNOFIXATION ELECTROPHORESIS
IGM, SERUM: 94 mg/dL (ref 50–300)
IMMUNOFIX ELECTR INT: NOT DETECTED
IgG (Immunoglobin G), Serum: 830 mg/dL (ref 600–1540)
Immunoglobulin A: 184 mg/dL (ref 20–320)

## 2017-10-04 LAB — PROTEIN ELECTROPHORESIS, SERUM
ALPHA 1: 0.3 g/dL (ref 0.2–0.3)
ALPHA 2: 0.8 g/dL (ref 0.5–0.9)
Albumin ELP: 4.1 g/dL (ref 3.8–4.8)
BETA GLOBULIN: 0.4 g/dL (ref 0.4–0.6)
Beta 2: 0.3 g/dL (ref 0.2–0.5)
Gamma Globulin: 0.8 g/dL (ref 0.8–1.7)
TOTAL PROTEIN: 6.7 g/dL (ref 6.1–8.1)

## 2017-10-04 LAB — COPPER, SERUM: Copper: 144 ug/dL (ref 70–175)

## 2017-10-04 LAB — METHYLMALONIC ACID, SERUM: Methylmalonic Acid, Quant: 312 nmol/L (ref 87–318)

## 2017-10-04 LAB — VITAMIN B1

## 2017-10-05 ENCOUNTER — Telehealth: Payer: Self-pay | Admitting: *Deleted

## 2017-10-05 NOTE — Telephone Encounter (Signed)
-----   Message from Alda Berthold, DO sent at 10/04/2017  2:37 PM EDT ----- Please let her know that vitamin B1 is very low, start thiamine 100mg  daily and try to cut back on alcohol. Thanks.

## 2017-10-05 NOTE — Telephone Encounter (Signed)
Left message for patient to call me back. 

## 2017-10-08 ENCOUNTER — Telehealth: Payer: Self-pay | Admitting: *Deleted

## 2017-10-08 NOTE — Telephone Encounter (Signed)
Patient given results and instructions.   

## 2017-10-08 NOTE — Telephone Encounter (Signed)
-----   Message from Alda Berthold, DO sent at 10/04/2017  2:37 PM EDT ----- Please let her know that vitamin B1 is very low, start thiamine 100mg  daily and try to cut back on alcohol. Thanks.

## 2017-10-16 ENCOUNTER — Ambulatory Visit (INDEPENDENT_AMBULATORY_CARE_PROVIDER_SITE_OTHER): Payer: PPO | Admitting: Neurology

## 2017-10-16 DIAGNOSIS — M5417 Radiculopathy, lumbosacral region: Secondary | ICD-10-CM

## 2017-10-16 DIAGNOSIS — R202 Paresthesia of skin: Secondary | ICD-10-CM | POA: Diagnosis not present

## 2017-10-16 NOTE — Procedures (Signed)
[facility]  [address]  [place], [place] [address] Tel: [phone] Fax:  [phone] Test Date:  10/16/2017  Patient: Tiffany Mejia DOB: 1935/05/28 Physician: Narda Amber, DO  Sex: Female Height: 5\' 6"  Ref Phys: Narda Amber, DO  ID#: [id number] Temp: 33.1C Technician:    Patient Complaints: This is a 82 year old female referred for evaluation of bilateral feet numbness.  NCV & EMG Findings: Extensive electrodiagnostic testing of the right lower extremity and additional studies of the left shows:  1. Bilateral sural and superficial peroneal sensory responses are within normal limits. 2. Right peroneal motor response shows mildly reduced amplitude at the extensor digitorum brevis, and is normal at the tibialis anterior. Left peroneal and bilateral tibial motor responses are within normal limits. 3. Right tibial H reflex study is mildly prolonged. Left tibial H reflex study is within normal limits. 4. Chronic motor axon loss changes are seen affecting L5 myotomes bilaterally, without accompanied active denervation.   Impression: 1. Chronic L5 radiculopathy affecting bilateral lower extremities, mild in degree electrically. 2. There is no evidence of a large fiber sensorimotor polyneuropathy affecting the lower extremities.   ___________________________ Narda Amber, DO    Nerve Conduction Studies Anti Sensory Summary Table   Site NR Peak (ms) Norm Peak (ms) P-T Amp (V) Norm P-T Amp  Left Sup Peroneal Anti Sensory (Ant Lat Mall)  33.1C  12 cm    2.7 <4.6 4.9 >3  Right Sup Peroneal Anti Sensory (Ant Lat Mall)  33.1C  12 cm    3.0 <4.6 4.9 >3  Left Sural Anti Sensory (Lat Mall)  33.1C  Calf    3.5 <4.6 11.1 >3  Right Sural Anti Sensory (Lat Mall)  33.1C  Calf    3.2 <4.6 11.3 >3   Motor Summary Table   Site NR Onset (ms) Norm Onset (ms) O-P Amp (mV) Norm O-P Amp Site1 Site2 Delta-0 (ms) Dist (cm) Vel (m/s) Norm Vel (m/s)  Left Peroneal  Motor (Ext Dig Brev)  33.1C  Ankle    5.9 <6.0 2.8 >2.5 B Fib Ankle 8.0 38.0 48 >40  B Fib    13.9  2.5  Poplt B Fib 1.3 9.0 69 >40  Poplt    15.2  2.4         Right Peroneal Motor (Ext Dig Brev)  33.1C  Ankle    5.1 <6.0 1.5 >2.5 B Fib Ankle 8.7 36.0 41 >40  B Fib    13.8  1.4  Poplt B Fib 1.9 8.0 42 >40  Poplt    15.7  1.4         Left Peroneal TA Motor (Tib Ant)  33.1C  Fib Head    3.5 <4.5 4.1 >3 Poplit Fib Head 1.3 9.0 69 >40  Poplit    4.8  4.0         Right Peroneal TA Motor (Tib Ant)  33.1C  Fib Head    4.0 <4.5 4.2 >3 Poplit Fib Head 1.2 9.0 75 >40  Poplit    5.2  4.2         Left Tibial Motor (Abd Hall Brev)  33.1C  Ankle    3.7 <6.0 8.0 >4 Knee Ankle 9.3 40.0 43 >40  Knee    13.0  5.0         Right Tibial Motor (Abd Hall Brev)  33.1C  Ankle    3.7 <6.0 5.8 >4 Knee Ankle 10.1 40.0 40 >40  Knee  13.8  3.2          H Reflex Studies   NR H-Lat (ms) Lat Norm (ms) L-R H-Lat (ms)  Left Tibial (Gastroc)  33.1C     34.42 <35 3.40  Right Tibial (Gastroc)  33.1C     37.82 <35 3.40   EMG   Side Muscle Ins Act Fibs Psw Fasc Number Recrt Dur Dur. Amp Amp. Poly Poly. Comment  Right AntTibialis Nml Nml Nml Nml 1- Rapid Some 1+ Some 1+ Nml Nml N/A  Right Gastroc Nml Nml Nml Nml Nml Nml Nml Nml Nml Nml Nml Nml N/A  Right Flex Dig Long Nml Nml Nml Nml 2- Rapid Some 1+ Some 1+ Nml Nml N/A  Right RectFemoris Nml Nml Nml Nml Nml Nml Nml Nml Nml Nml Nml Nml N/A  Right GluteusMed Nml Nml Nml Nml 1- Rapid Some 1+ Some 1+ Nml Nml N/A  Left GluteusMed Nml Nml Nml Nml 1- Rapid Some 1+ Some 1+ Nml Nml N/A  Left AntTibialis Nml Nml Nml Nml 1- Rapid Some 1+ Some 1+ Nml Nml N/A  Left Gastroc Nml Nml Nml Nml Nml Nml Nml Nml Nml Nml Nml Nml N/A  Left Flex Dig Long Nml Nml Nml Nml 2- Rapid Some 1+ Some 1+ Nml Nml N/A  Left RectFemoris Nml Nml Nml Nml Nml Nml Nml Nml Nml Nml Nml Nml N/A      Waveforms:

## 2017-10-18 ENCOUNTER — Telehealth: Payer: Self-pay | Admitting: Neurology

## 2017-10-18 NOTE — Telephone Encounter (Signed)
Discussed results of EMG. No signs of large fiber neuropathy, she has mild chronic L5 radiculopathy. I believe she has a small fiber neuropathy that is contributing to her feet paresthesias. We discussed the role of skin biopsy, which she would like to hold off for now. She has vitamin D deficiency and was recommended to start vitamin B 1 100 mg daily. I also encouraged her to try to cut back on her alcohol intake.  She will also start physical therapy for balance training.  She will come back and see me in 6 months.  Rashay Barnette K. Posey Pronto, DO

## 2017-11-08 ENCOUNTER — Encounter: Payer: Self-pay | Admitting: Neurology

## 2017-12-14 DIAGNOSIS — H01004 Unspecified blepharitis left upper eyelid: Secondary | ICD-10-CM | POA: Diagnosis not present

## 2017-12-14 DIAGNOSIS — H01005 Unspecified blepharitis left lower eyelid: Secondary | ICD-10-CM | POA: Diagnosis not present

## 2018-01-14 DIAGNOSIS — Z85828 Personal history of other malignant neoplasm of skin: Secondary | ICD-10-CM | POA: Diagnosis not present

## 2018-01-14 DIAGNOSIS — D485 Neoplasm of uncertain behavior of skin: Secondary | ICD-10-CM | POA: Diagnosis not present

## 2018-01-14 DIAGNOSIS — B351 Tinea unguium: Secondary | ICD-10-CM | POA: Diagnosis not present

## 2018-01-14 DIAGNOSIS — L821 Other seborrheic keratosis: Secondary | ICD-10-CM | POA: Diagnosis not present

## 2018-01-14 DIAGNOSIS — L814 Other melanin hyperpigmentation: Secondary | ICD-10-CM | POA: Diagnosis not present

## 2018-01-14 DIAGNOSIS — L708 Other acne: Secondary | ICD-10-CM | POA: Diagnosis not present

## 2018-01-14 DIAGNOSIS — D1801 Hemangioma of skin and subcutaneous tissue: Secondary | ICD-10-CM | POA: Diagnosis not present

## 2018-01-14 DIAGNOSIS — L439 Lichen planus, unspecified: Secondary | ICD-10-CM | POA: Diagnosis not present

## 2018-03-15 DIAGNOSIS — J310 Chronic rhinitis: Secondary | ICD-10-CM | POA: Diagnosis not present

## 2018-04-20 NOTE — Progress Notes (Deleted)
Follow-up Visit   Date: 04/20/18  Tiffany Mejia MRN: [id number] DOB: [birth year]   Interim History: Tiffany Mejia is a 83 y.o. right-handed Caucasian female with hypertension, hyperlipidemia, GERD, macular degeneration returning to the clinic for follow-up of peripheral neuropathy and thiamine deficiency.  The patient was accompanied to the clinic by *** who also provides collateral information.    History of present illness: Starting around 2012, she began experiencing numbness of the feet, which is worse in the left.  She has tingling over the tips of her toes, which is worse at nighttime.  She also complains of fluttering sensation over the lower legs and cramps, as well as intermittent sharp stinging pain which is not severe.  She denies and leg weakness or falls.  She has imbalance and is careful when walking, part of this is also due to being legally blind due to severe macular degeneration. She saw Dr. Jannifer Franklin in 2015 for symptoms and NCS/EMG of the right arm and leg which showed mild carpal tunnel syndrome, no findings to support large fiber neuropathy.  MRI lumbar spine from 2017 shows mild degenerative changes, no focal nerve impingement.  She was tried on gabapentin, but unable to titrate due to sedation. Nothing that makes symptoms worse.   She admits to drinking 1-2 glasses of wine nightly for the past 15 years, sometimes more.  She recently moved into [facility] and is struggling with this transition.  UPDATE ***:  Medications:  Current Outpatient Medications on File Prior to Visit  Medication Sig Dispense Refill  . clobetasol ointment (TEMOVATE) 7.67 % Apply 1 application topically 2 (two) times daily. Reported on 06/02/2015    . lisinopril (PRINIVIL,ZESTRIL) 2.5 MG tablet Take 2.5 mg by mouth daily.    . Melatonin 3 MG CAPS Take 2 capsules by mouth.     . Multiple Vitamins-Minerals (VITEYES AREDS FORMULA/LUTEIN) CAPS Take 1 capsule by mouth daily.    Marland Kitchen PREMARIN  vaginal cream Place 2.094 Applicatorfuls vaginally at bedtime as needed.     No current facility-administered medications on file prior to visit.     Allergies:  Allergies  Allergen Reactions  . Nexium [Esomeprazole Magnesium] Rash    Review of Systems:  CONSTITUTIONAL: No fevers, chills, night sweats, or weight loss.  EYES: No visual changes or eye pain ENT: No hearing changes.  No history of nose bleeds.   RESPIRATORY: No cough, wheezing and shortness of breath.   CARDIOVASCULAR: Negative for chest pain, and palpitations.   GI: Negative for abdominal discomfort, blood in stools or black stools.  No recent change in bowel habits.   GU:  No history of incontinence.   MUSCLOSKELETAL: No history of joint pain or swelling.  No myalgias.   SKIN: Negative for lesions, rash, and itching.   ENDOCRINE: Negative for cold or heat intolerance, polydipsia or goiter.   PSYCH:  *** depression or anxiety symptoms.   NEURO: As Above.   Vital Signs:  There were no vitals taken for this visit. Pain Scale: *** on a scale of 0-10   General: *** Neck: *** carotid bruit CV: *** Ext: ***  Neurological Exam: MENTAL STATUS including orientation to time, place, person, recent and remote memory, attention span and concentration, language, and fund of knowledge is ***normal.  Speech is not dysarthric.  CRANIAL NERVES: No visual field defects. *** Pupils equal round and reactive to light.  Normal conjugate, extra-ocular eye movements in all directions of gaze.  No ptosis ***. Normal facial  sensation.  Face is symmetric. Palate elevates symmetrically.  Tongue is midline.  MOTOR:  Motor strength is 5/5 in all extremities, except ***.  No atrophy, fasciculations or abnormal movements.  No pronator drift.  Tone is normal.    MSRs:  Reflexes are 2+/4 throughout, except ***.  SENSORY:  Intact to ***.  COORDINATION/GAIT:  Normal finger-to- nose-finger and heel-to-shin.  Intact rapid alternating movements  bilaterally.  Gait narrow based and stable.   Data:*** Labs 05/29/2017:  HbA1c 5.6, TSH 2.77, vitamin B12 213 Labs 10/01/2017:  Copper 144, MMA 312, SPEP with IFE no M protein, vitamin B1 <6**  MRI lumbar spine wo contrast 03/30/2015: 1. Since the prior study from 2006, there is mildly progressive disc bulging and facet disease from L2-3 through L4-5. 2. At L3-4, there is a grade 1 anterolisthesis contributing to mildly progressive spinal stenosis, still mild to moderate in degree. 3. The foramina and lateral recesses appear adequately patent at all levels. No acute findings or nerve root encroachment identified.  NCS/EMG of the legs 10/16/2017: 1. Chronic L5 radiculopathy affecting bilateral lower extremities, mild in degree electrically. 2. There is no evidence of a large fiber sensorimotor polyneuropathy affecting the lower extremities.  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  Greater than 50% of this time was spent in counseling, explanation of diagnosis, planning of further management, and coordination of care.   Thank you for allowing me to participate in patient's care.  If I can answer any additional questions, I would be pleased to do so.    Sincerely,    Donika K. Posey Pronto, DO

## 2018-04-22 ENCOUNTER — Ambulatory Visit: Payer: PPO | Admitting: Neurology

## 2018-06-10 ENCOUNTER — Ambulatory Visit: Payer: PPO | Admitting: Neurology

## 2018-06-20 DIAGNOSIS — E538 Deficiency of other specified B group vitamins: Secondary | ICD-10-CM | POA: Diagnosis not present

## 2018-06-20 DIAGNOSIS — E785 Hyperlipidemia, unspecified: Secondary | ICD-10-CM | POA: Diagnosis not present

## 2018-06-20 DIAGNOSIS — I10 Essential (primary) hypertension: Secondary | ICD-10-CM | POA: Diagnosis not present

## 2018-06-20 DIAGNOSIS — R739 Hyperglycemia, unspecified: Secondary | ICD-10-CM | POA: Diagnosis not present

## 2018-06-20 DIAGNOSIS — E559 Vitamin D deficiency, unspecified: Secondary | ICD-10-CM | POA: Diagnosis not present

## 2018-06-24 DIAGNOSIS — E785 Hyperlipidemia, unspecified: Secondary | ICD-10-CM | POA: Diagnosis not present

## 2018-06-24 DIAGNOSIS — I10 Essential (primary) hypertension: Secondary | ICD-10-CM | POA: Diagnosis not present

## 2018-06-24 DIAGNOSIS — Z0000 Encounter for general adult medical examination without abnormal findings: Secondary | ICD-10-CM | POA: Diagnosis not present

## 2018-06-24 DIAGNOSIS — M4800 Spinal stenosis, site unspecified: Secondary | ICD-10-CM | POA: Diagnosis not present

## 2018-06-24 DIAGNOSIS — K219 Gastro-esophageal reflux disease without esophagitis: Secondary | ICD-10-CM | POA: Diagnosis not present

## 2018-08-01 DIAGNOSIS — R109 Unspecified abdominal pain: Secondary | ICD-10-CM | POA: Diagnosis not present

## 2018-08-01 DIAGNOSIS — M549 Dorsalgia, unspecified: Secondary | ICD-10-CM | POA: Diagnosis not present

## 2018-08-07 ENCOUNTER — Other Ambulatory Visit: Payer: Self-pay | Admitting: Family Medicine

## 2018-08-07 DIAGNOSIS — R1084 Generalized abdominal pain: Secondary | ICD-10-CM

## 2018-08-08 ENCOUNTER — Ambulatory Visit
Admission: RE | Admit: 2018-08-08 | Discharge: 2018-08-08 | Disposition: A | Discharge status: Home / Self Care | Payer: PPO | Source: Ambulatory Visit | Attending: Family Medicine | Admitting: Family Medicine

## 2018-08-08 DIAGNOSIS — R1084 Generalized abdominal pain: Secondary | ICD-10-CM

## 2018-08-08 DIAGNOSIS — N281 Cyst of kidney, acquired: Secondary | ICD-10-CM | POA: Diagnosis not present

## 2018-08-13 DIAGNOSIS — R1084 Generalized abdominal pain: Secondary | ICD-10-CM | POA: Diagnosis not present

## 2018-08-22 DIAGNOSIS — M4310 Spondylolisthesis, site unspecified: Secondary | ICD-10-CM | POA: Diagnosis not present

## 2018-08-22 DIAGNOSIS — I10 Essential (primary) hypertension: Secondary | ICD-10-CM | POA: Diagnosis not present

## 2018-09-02 ENCOUNTER — Ambulatory Visit (INDEPENDENT_AMBULATORY_CARE_PROVIDER_SITE_OTHER): Payer: PPO | Admitting: Neurology

## 2018-09-02 ENCOUNTER — Other Ambulatory Visit: Payer: Self-pay

## 2018-09-02 ENCOUNTER — Encounter: Payer: Self-pay | Admitting: Neurology

## 2018-09-02 VITALS — BP 100/58 | HR 64 | Ht 66.0 in | Wt 145.3 lb

## 2018-09-02 DIAGNOSIS — E5111 Dry beriberi: Secondary | ICD-10-CM

## 2018-09-02 DIAGNOSIS — G629 Polyneuropathy, unspecified: Secondary | ICD-10-CM | POA: Diagnosis not present

## 2018-09-02 NOTE — Progress Notes (Signed)
Follow-up Visit   Date: 09/02/18   Tiffany Mejia MRN: [id number] DOB: 1935-05-10   Interim History: Tiffany Mejia is a 83 y.o. right-handed Caucasian female with hypertension, hyperlipidemia, GERD, macular degeneration returning to the clinic for follow-up of probable small fiber neuropathy.  The patient was accompanied to the clinic by self.  History of present illness: Starting around 2012, she began experiencing numbness of the feet, which is worse in the left.  She has tingling over the tips of her toes, which is worse at nighttime.  She also complains of fluttering sensation over the lower legs and cramps, as well as intermittent sharp stinging pain which is not severe.  She denies and leg weakness or falls.  She has imbalance and is careful when walking, part of this is also due to being legally blind due to severe macular degeneration. She saw Dr. Jannifer Franklin in 2015 for symptoms and NCS/EMG of the right arm and leg which showed mild carpal tunnel syndrome, no findings to support large fiber neuropathy.  MRI lumbar spine from 2017 shows mild degenerative changes, no focal nerve impingement.  She was tried on gabapentin, but unable to titrate due to sedation. Nothing that makes symptoms worse.   She admits to drinking 1-2 glasses of wine nightly for the past 15 years, sometimes more.  She recently moved into [place] and is struggling with this transition.  UPDATE 09/02/2018: She is here for 62-month follow-up visit.  She is no longer [place] and returned home within 8 weeks of her move there.  She is living alone and has a two-level home.  Her son lives in town.  The numbness in her feet may be slightly worse than her last visit, there is no associated pain.  Her labs indicated thiamine deficiency and she has been compliant with taking supplementation.  She has also reduced her alcohol intake to few days per week.  She is compliant with using a cane for imbalance. A few weeks ago,  she had severe right buttocks pain that was radiating into her abdomen and occasionally down the back of her thigh.  She treated this with ibuprofen which has significantly helped.  She has been doing her own home exercises.  Medications:  Current Outpatient Medications on File Prior to Visit  Medication Sig Dispense Refill  . Cholecalciferol (VITAMIN D3) 125 MCG (5000 UT) CAPS Take by mouth daily.    . clobetasol ointment (TEMOVATE) 6.14 % Apply 1 application topically 2 (two) times daily. Reported on 06/02/2015    . dicyclomine (BENTYL) 20 MG tablet Take 20 mg by mouth every 6 (six) hours.    Marland Kitchen lisinopril (PRINIVIL,ZESTRIL) 2.5 MG tablet Take 2.5 mg by mouth daily.    . Melatonin 3 MG CAPS Take 2 capsules by mouth.     . Multiple Vitamins-Minerals (VITEYES AREDS FORMULA/LUTEIN) CAPS Take 1 capsule by mouth daily.    Marland Kitchen PREMARIN vaginal cream Place 4.315 Applicatorfuls vaginally at bedtime as needed.    . thiamine 100 MG tablet Take 100 mg by mouth daily.    . vitamin B-12 (CYANOCOBALAMIN) 500 MCG tablet Take 500 mcg by mouth daily.     No current facility-administered medications on file prior to visit.     Allergies:  Allergies  Allergen Reactions  . Nexium [Esomeprazole Magnesium] Rash    Review of Systems:  CONSTITUTIONAL: No fevers, chills, night sweats, or weight loss.  EYES: No visual changes or eye pain ENT: No hearing changes.  No  history of nose bleeds.   RESPIRATORY: No cough, wheezing and shortness of breath.   CARDIOVASCULAR: Negative for chest pain, and palpitations.   GI: Negative for abdominal discomfort, blood in stools or black stools.  No recent change in bowel habits.   GU:  No history of incontinence.   MUSCLOSKELETAL: +history of joint pain or swelling.  No myalgias.   SKIN: Negative for lesions, rash, and itching.   ENDOCRINE: Negative for cold or heat intolerance, polydipsia or goiter.   PSYCH:  No depression or anxiety symptoms.   NEURO: As Above.    Vital Signs:  BP (!) 100/58   Pulse 64   Ht 5\' 6"  (1.676 m)   Wt 145 lb 5 oz (65.9 kg)   SpO2 97%   BMI 23.45 kg/m    General Medical Exam:   General:  Well appearing, comfortable  Eyes/ENT: see cranial nerve examination.   Neck:  No carotid bruits. Respiratory:  Clear to auscultation, good air entry bilaterally.   Cardiac:  Regular rate and rhythm, no murmur.   Ext:  No edema   Neurological Exam: MENTAL STATUS including orientation to time, place, person, recent and remote memory, attention span and concentration, language, and fund of knowledge is normal.  Speech is not dysarthric.  CRANIAL NERVES:  No visual field defects.  Pupils equal round and reactive to light.  Normal conjugate, extra-ocular eye movements in all directions of gaze.  No ptosis.  Face is symmetric. Palate elevates symmetrically.  Tongue is midline.  MOTOR:  Motor strength is 5/5 in all extremities, except toe flexion/extension is 4/5 bilaterally.  No atrophy, fasciculations or abnormal movements.  No pronator drift.  Tone is normal.    MSRs:                                           Right        Left brachioradialis 2+  2+  biceps 2+  2+  triceps 2+  2+  patellar 3+  3+  ankle jerk 2+  2+  Hoffman no  no  plantar response down  down   SENSORY: Vibration is reduced at the ankles bilaterally.  Pinprick and temperature is mildly reduced over the dorsum of the feet.  Romberg sign is positive.  COORDINATION/GAIT:  Normal finger-to- nose-finger.  Intact rapid alternating movements bilaterally.  Gait is wide-based and stable.  She has difficulty standing on heels and toes.  Data: NCS/EMG of the legs 10/16/2017: 1. Chronic L5 radiculopathy affecting bilateral lower extremities, mild in degree electrically. 2. There is no evidence of a large fiber sensorimotor polyneuropathy affecting the lower extremities.  Labs 05/29/2017:  HbA1c 5.6, TSH 2.77, vitamin B12 213 Labs 10/01/2017:  MMA 312, SPEP with IFE no M  protein, copper 144, vitamin B1 <6*  MRI lumbar spine wo contrast 03/30/2015: 1. Since the prior study from 2006, there is mildly progressive disc bulging and facet disease from L2-3 through L4-5. 2. At L3-4, there is a grade 1 anterolisthesis contributing to mildly progressive spinal stenosis, still mild to moderate in degree. 3. The foramina and lateral recesses appear adequately patent at all levels. No acute findings or nerve root encroachment identified.  NCS/EMG of the right arm and bilateral legs 04/11/2013: Nerve conduction studies done on the right arm and both lower extremities does not show clear evidence of a peripheral neuropathy. There appears  to be a borderline right carpal tunnel syndrome. EMG evaluation of the left lower extremity shows findings that are consistent with a mild, chronic, stable L5 radiculopathy. EMG evaluation of the right upper extremity was unremarkable, without evidence of an overlying cervical radiculopathy.   IMPRESSION/PLAN: Probable small fiber neuropathy affecting the feet. NCS/EMG x 2 (2015, 2019) did not show evidence of large fiber neuropathy.  Her most recent EMG showed mild L5 radiculopathy bilaterally.  Her exam and history is most suggestive of a distal and symmetric neuropathy.  At her last visit, I discussed that alcohol use can certainly contribute to neuropathy and this may be augmented by thiamine deficiency.  She has significantly reduced alcohol intake to sparingly throughout the week and has been compliant with thiamine supplementation.  I recommend that she continue vitamin B1 100mg  daily and continue to limit alcohol intake.  Fall precautions were discussed.  She is doing her own home exercises for balance and compliant with daily foot inspection.  If her symptoms progress or get worse, she welcome to come back and see me. Continue thiamine 100mg  daily  Thank you for allowing me to participate in patient's care.  If I can answer any additional  questions, I would be pleased to do so.    Sincerely,    Yamna Mackel K. Posey Pronto, DO

## 2018-09-02 NOTE — Patient Instructions (Addendum)
Continue vitamin B1 100mg  daily  Continue your home exercises  Always use your cane  Return to clinic as needed

## 2018-10-03 DIAGNOSIS — H25812 Combined forms of age-related cataract, left eye: Secondary | ICD-10-CM | POA: Diagnosis not present

## 2018-10-03 DIAGNOSIS — Z961 Presence of intraocular lens: Secondary | ICD-10-CM | POA: Diagnosis not present

## 2018-10-03 DIAGNOSIS — H353134 Nonexudative age-related macular degeneration, bilateral, advanced atrophic with subfoveal involvement: Secondary | ICD-10-CM | POA: Diagnosis not present

## 2018-10-15 DIAGNOSIS — G6289 Other specified polyneuropathies: Secondary | ICD-10-CM | POA: Diagnosis not present

## 2018-11-12 DIAGNOSIS — G6289 Other specified polyneuropathies: Secondary | ICD-10-CM | POA: Diagnosis not present

## 2018-11-14 DIAGNOSIS — I10 Essential (primary) hypertension: Secondary | ICD-10-CM | POA: Diagnosis not present

## 2018-11-14 DIAGNOSIS — M4800 Spinal stenosis, site unspecified: Secondary | ICD-10-CM | POA: Diagnosis not present

## 2018-11-19 DIAGNOSIS — M4800 Spinal stenosis, site unspecified: Secondary | ICD-10-CM | POA: Diagnosis not present

## 2018-11-19 DIAGNOSIS — I10 Essential (primary) hypertension: Secondary | ICD-10-CM | POA: Diagnosis not present

## 2019-01-22 DIAGNOSIS — D485 Neoplasm of uncertain behavior of skin: Secondary | ICD-10-CM | POA: Diagnosis not present

## 2019-01-22 DIAGNOSIS — R238 Other skin changes: Secondary | ICD-10-CM | POA: Diagnosis not present

## 2019-01-22 DIAGNOSIS — L308 Other specified dermatitis: Secondary | ICD-10-CM | POA: Diagnosis not present

## 2019-01-22 DIAGNOSIS — L989 Disorder of the skin and subcutaneous tissue, unspecified: Secondary | ICD-10-CM | POA: Diagnosis not present

## 2019-01-22 DIAGNOSIS — D229 Melanocytic nevi, unspecified: Secondary | ICD-10-CM | POA: Diagnosis not present

## 2019-01-22 DIAGNOSIS — B351 Tinea unguium: Secondary | ICD-10-CM | POA: Diagnosis not present

## 2019-01-22 DIAGNOSIS — D1801 Hemangioma of skin and subcutaneous tissue: Secondary | ICD-10-CM | POA: Diagnosis not present

## 2019-01-22 DIAGNOSIS — L439 Lichen planus, unspecified: Secondary | ICD-10-CM | POA: Diagnosis not present

## 2019-01-22 DIAGNOSIS — D225 Melanocytic nevi of trunk: Secondary | ICD-10-CM | POA: Diagnosis not present

## 2019-01-22 DIAGNOSIS — Z85828 Personal history of other malignant neoplasm of skin: Secondary | ICD-10-CM | POA: Diagnosis not present

## 2019-01-27 DIAGNOSIS — H26491 Other secondary cataract, right eye: Secondary | ICD-10-CM | POA: Diagnosis not present

## 2019-01-27 DIAGNOSIS — H5212 Myopia, left eye: Secondary | ICD-10-CM | POA: Diagnosis not present

## 2019-01-27 DIAGNOSIS — H2512 Age-related nuclear cataract, left eye: Secondary | ICD-10-CM | POA: Diagnosis not present

## 2019-01-27 DIAGNOSIS — H353133 Nonexudative age-related macular degeneration, bilateral, advanced atrophic without subfoveal involvement: Secondary | ICD-10-CM | POA: Diagnosis not present

## 2019-02-06 DIAGNOSIS — M19042 Primary osteoarthritis, left hand: Secondary | ICD-10-CM | POA: Diagnosis not present

## 2019-02-06 DIAGNOSIS — M19049 Primary osteoarthritis, unspecified hand: Secondary | ICD-10-CM | POA: Diagnosis not present

## 2019-02-06 DIAGNOSIS — M19041 Primary osteoarthritis, right hand: Secondary | ICD-10-CM | POA: Diagnosis not present

## 2019-02-06 DIAGNOSIS — M180 Bilateral primary osteoarthritis of first carpometacarpal joints: Secondary | ICD-10-CM | POA: Diagnosis not present

## 2019-04-01 DIAGNOSIS — L304 Erythema intertrigo: Secondary | ICD-10-CM | POA: Diagnosis not present

## 2019-04-02 ENCOUNTER — Ambulatory Visit: Payer: PPO

## 2019-05-16 DIAGNOSIS — N3000 Acute cystitis without hematuria: Secondary | ICD-10-CM | POA: Diagnosis not present

## 2019-06-03 DIAGNOSIS — I10 Essential (primary) hypertension: Secondary | ICD-10-CM | POA: Diagnosis not present

## 2019-06-03 DIAGNOSIS — R739 Hyperglycemia, unspecified: Secondary | ICD-10-CM | POA: Diagnosis not present

## 2019-06-03 DIAGNOSIS — E785 Hyperlipidemia, unspecified: Secondary | ICD-10-CM | POA: Diagnosis not present

## 2019-06-03 DIAGNOSIS — E611 Iron deficiency: Secondary | ICD-10-CM | POA: Diagnosis not present

## 2019-06-03 DIAGNOSIS — E538 Deficiency of other specified B group vitamins: Secondary | ICD-10-CM | POA: Diagnosis not present

## 2019-06-03 DIAGNOSIS — R300 Dysuria: Secondary | ICD-10-CM | POA: Diagnosis not present

## 2019-06-03 DIAGNOSIS — R5382 Chronic fatigue, unspecified: Secondary | ICD-10-CM | POA: Diagnosis not present

## 2019-06-03 DIAGNOSIS — E559 Vitamin D deficiency, unspecified: Secondary | ICD-10-CM | POA: Diagnosis not present

## 2019-06-12 DIAGNOSIS — M1812 Unilateral primary osteoarthritis of first carpometacarpal joint, left hand: Secondary | ICD-10-CM | POA: Diagnosis not present

## 2019-06-12 DIAGNOSIS — M1811 Unilateral primary osteoarthritis of first carpometacarpal joint, right hand: Secondary | ICD-10-CM | POA: Diagnosis not present

## 2019-06-25 DIAGNOSIS — E785 Hyperlipidemia, unspecified: Secondary | ICD-10-CM | POA: Diagnosis not present

## 2019-06-25 DIAGNOSIS — G4700 Insomnia, unspecified: Secondary | ICD-10-CM | POA: Diagnosis not present

## 2019-06-25 DIAGNOSIS — M4800 Spinal stenosis, site unspecified: Secondary | ICD-10-CM | POA: Diagnosis not present

## 2019-06-25 DIAGNOSIS — I10 Essential (primary) hypertension: Secondary | ICD-10-CM | POA: Diagnosis not present

## 2019-06-25 DIAGNOSIS — Z0000 Encounter for general adult medical examination without abnormal findings: Secondary | ICD-10-CM | POA: Diagnosis not present

## 2019-06-26 DIAGNOSIS — E538 Deficiency of other specified B group vitamins: Secondary | ICD-10-CM | POA: Diagnosis not present

## 2019-10-15 DIAGNOSIS — M532X7 Spinal instabilities, lumbosacral region: Secondary | ICD-10-CM | POA: Diagnosis not present

## 2019-10-21 DIAGNOSIS — M532X7 Spinal instabilities, lumbosacral region: Secondary | ICD-10-CM | POA: Diagnosis not present

## 2019-10-23 DIAGNOSIS — M532X7 Spinal instabilities, lumbosacral region: Secondary | ICD-10-CM | POA: Diagnosis not present

## 2019-10-27 DIAGNOSIS — M532X7 Spinal instabilities, lumbosacral region: Secondary | ICD-10-CM | POA: Diagnosis not present

## 2019-10-31 DIAGNOSIS — E538 Deficiency of other specified B group vitamins: Secondary | ICD-10-CM | POA: Diagnosis not present

## 2019-10-31 DIAGNOSIS — R399 Unspecified symptoms and signs involving the genitourinary system: Secondary | ICD-10-CM | POA: Diagnosis not present

## 2019-10-31 DIAGNOSIS — R1013 Epigastric pain: Secondary | ICD-10-CM | POA: Diagnosis not present

## 2019-11-12 DIAGNOSIS — E538 Deficiency of other specified B group vitamins: Secondary | ICD-10-CM | POA: Diagnosis not present

## 2019-12-09 DIAGNOSIS — Z23 Encounter for immunization: Secondary | ICD-10-CM | POA: Diagnosis not present

## 2020-01-06 DIAGNOSIS — K219 Gastro-esophageal reflux disease without esophagitis: Secondary | ICD-10-CM | POA: Diagnosis not present

## 2020-01-06 DIAGNOSIS — K582 Mixed irritable bowel syndrome: Secondary | ICD-10-CM | POA: Diagnosis not present

## 2020-01-15 DIAGNOSIS — J329 Chronic sinusitis, unspecified: Secondary | ICD-10-CM | POA: Diagnosis not present

## 2020-01-15 DIAGNOSIS — J321 Chronic frontal sinusitis: Secondary | ICD-10-CM | POA: Diagnosis not present

## 2020-01-20 DIAGNOSIS — R1013 Epigastric pain: Secondary | ICD-10-CM | POA: Diagnosis not present

## 2020-01-20 DIAGNOSIS — K582 Mixed irritable bowel syndrome: Secondary | ICD-10-CM | POA: Diagnosis not present

## 2020-01-20 DIAGNOSIS — K649 Unspecified hemorrhoids: Secondary | ICD-10-CM | POA: Diagnosis not present

## 2020-01-26 DIAGNOSIS — J342 Deviated nasal septum: Secondary | ICD-10-CM | POA: Diagnosis not present

## 2020-01-26 DIAGNOSIS — H6122 Impacted cerumen, left ear: Secondary | ICD-10-CM | POA: Diagnosis not present

## 2020-01-26 DIAGNOSIS — H9313 Tinnitus, bilateral: Secondary | ICD-10-CM | POA: Diagnosis not present

## 2020-02-03 DIAGNOSIS — H5212 Myopia, left eye: Secondary | ICD-10-CM | POA: Diagnosis not present

## 2020-02-03 DIAGNOSIS — H2512 Age-related nuclear cataract, left eye: Secondary | ICD-10-CM | POA: Diagnosis not present

## 2020-02-03 DIAGNOSIS — H353133 Nonexudative age-related macular degeneration, bilateral, advanced atrophic without subfoveal involvement: Secondary | ICD-10-CM | POA: Diagnosis not present

## 2020-02-05 DIAGNOSIS — R21 Rash and other nonspecific skin eruption: Secondary | ICD-10-CM | POA: Diagnosis not present

## 2020-03-18 DIAGNOSIS — H25812 Combined forms of age-related cataract, left eye: Secondary | ICD-10-CM | POA: Diagnosis not present

## 2020-03-18 DIAGNOSIS — H2512 Age-related nuclear cataract, left eye: Secondary | ICD-10-CM | POA: Diagnosis not present

## 2020-04-08 DIAGNOSIS — M1811 Unilateral primary osteoarthritis of first carpometacarpal joint, right hand: Secondary | ICD-10-CM | POA: Diagnosis not present

## 2020-04-08 DIAGNOSIS — M1812 Unilateral primary osteoarthritis of first carpometacarpal joint, left hand: Secondary | ICD-10-CM | POA: Diagnosis not present

## 2020-04-09 DIAGNOSIS — M1812 Unilateral primary osteoarthritis of first carpometacarpal joint, left hand: Secondary | ICD-10-CM | POA: Diagnosis not present

## 2020-04-09 DIAGNOSIS — M1811 Unilateral primary osteoarthritis of first carpometacarpal joint, right hand: Secondary | ICD-10-CM | POA: Diagnosis not present

## 2020-06-17 DIAGNOSIS — H353124 Nonexudative age-related macular degeneration, left eye, advanced atrophic with subfoveal involvement: Secondary | ICD-10-CM | POA: Diagnosis not present

## 2020-06-17 DIAGNOSIS — H353114 Nonexudative age-related macular degeneration, right eye, advanced atrophic with subfoveal involvement: Secondary | ICD-10-CM | POA: Diagnosis not present

## 2020-06-17 DIAGNOSIS — H353134 Nonexudative age-related macular degeneration, bilateral, advanced atrophic with subfoveal involvement: Secondary | ICD-10-CM | POA: Diagnosis not present

## 2020-06-17 DIAGNOSIS — H25812 Combined forms of age-related cataract, left eye: Secondary | ICD-10-CM | POA: Diagnosis not present

## 2020-06-28 DIAGNOSIS — R197 Diarrhea, unspecified: Secondary | ICD-10-CM | POA: Diagnosis not present

## 2020-07-09 DIAGNOSIS — M4800 Spinal stenosis, site unspecified: Secondary | ICD-10-CM | POA: Diagnosis not present

## 2020-07-09 DIAGNOSIS — I10 Essential (primary) hypertension: Secondary | ICD-10-CM | POA: Diagnosis not present

## 2020-07-09 DIAGNOSIS — E559 Vitamin D deficiency, unspecified: Secondary | ICD-10-CM | POA: Diagnosis not present

## 2020-07-09 DIAGNOSIS — E785 Hyperlipidemia, unspecified: Secondary | ICD-10-CM | POA: Diagnosis not present

## 2020-07-09 DIAGNOSIS — E538 Deficiency of other specified B group vitamins: Secondary | ICD-10-CM | POA: Diagnosis not present

## 2020-07-09 DIAGNOSIS — R5382 Chronic fatigue, unspecified: Secondary | ICD-10-CM | POA: Diagnosis not present

## 2020-07-09 DIAGNOSIS — Z0000 Encounter for general adult medical examination without abnormal findings: Secondary | ICD-10-CM | POA: Diagnosis not present

## 2020-07-09 DIAGNOSIS — Z1211 Encounter for screening for malignant neoplasm of colon: Secondary | ICD-10-CM | POA: Diagnosis not present

## 2020-07-09 DIAGNOSIS — R739 Hyperglycemia, unspecified: Secondary | ICD-10-CM | POA: Diagnosis not present

## 2020-07-22 DIAGNOSIS — M19049 Primary osteoarthritis, unspecified hand: Secondary | ICD-10-CM | POA: Diagnosis not present

## 2020-08-24 DIAGNOSIS — Z1211 Encounter for screening for malignant neoplasm of colon: Secondary | ICD-10-CM | POA: Diagnosis not present

## 2020-09-23 DIAGNOSIS — R350 Frequency of micturition: Secondary | ICD-10-CM | POA: Diagnosis not present

## 2020-09-23 DIAGNOSIS — R531 Weakness: Secondary | ICD-10-CM | POA: Diagnosis not present

## 2020-11-19 DIAGNOSIS — I10 Essential (primary) hypertension: Secondary | ICD-10-CM | POA: Diagnosis not present

## 2020-11-19 DIAGNOSIS — B351 Tinea unguium: Secondary | ICD-10-CM | POA: Diagnosis not present

## 2020-11-19 DIAGNOSIS — Z23 Encounter for immunization: Secondary | ICD-10-CM | POA: Diagnosis not present

## 2020-11-19 DIAGNOSIS — E538 Deficiency of other specified B group vitamins: Secondary | ICD-10-CM | POA: Diagnosis not present

## 2020-12-29 DIAGNOSIS — H26493 Other secondary cataract, bilateral: Secondary | ICD-10-CM | POA: Diagnosis not present

## 2020-12-29 DIAGNOSIS — H353133 Nonexudative age-related macular degeneration, bilateral, advanced atrophic without subfoveal involvement: Secondary | ICD-10-CM | POA: Diagnosis not present

## 2020-12-29 DIAGNOSIS — H52203 Unspecified astigmatism, bilateral: Secondary | ICD-10-CM | POA: Diagnosis not present

## 2021-01-13 DIAGNOSIS — M1811 Unilateral primary osteoarthritis of first carpometacarpal joint, right hand: Secondary | ICD-10-CM | POA: Diagnosis not present

## 2021-01-13 DIAGNOSIS — M1812 Unilateral primary osteoarthritis of first carpometacarpal joint, left hand: Secondary | ICD-10-CM | POA: Diagnosis not present

## 2021-01-21 DIAGNOSIS — R350 Frequency of micturition: Secondary | ICD-10-CM | POA: Diagnosis not present

## 2021-01-21 DIAGNOSIS — N3090 Cystitis, unspecified without hematuria: Secondary | ICD-10-CM | POA: Diagnosis not present

## 2021-01-24 ENCOUNTER — Telehealth: Payer: Self-pay | Admitting: Family Medicine

## 2021-01-24 NOTE — Telephone Encounter (Signed)
   Tiffany Mejia DOB: Mar 31, 1935 MRN: [id number]   RIDER WAIVER AND RELEASE OF LIABILITY  For purposes of improving physical access to our facilities, [facility] is pleased to partner with third parties to provide [place] [facility] patients or other authorized individuals the option of convenient, on-demand ground transportation services (the Technical brewer") through use of the technology service that enables users to request on-demand ground transportation from independent third-party providers.  By opting to use and accept these [employer], I, the undersigned, hereby agree on behalf of myself, and on behalf of any minor child using the Government social research officer for whom I am the parent or legal guardian, as follows:  Government social research officer provided to me are provided by independent third-party transportation providers who are not [employer] or employees and who are unaffiliated with [employer]. [facility] is neither a transportation carrier nor a common or public carrier. [facility] has no control over the quality or safety of the transportation that occurs as a result of the [employer]. [facility] cannot guarantee that any third-party transportation provider will complete any arranged transportation service. [facility] makes no representation, warranty, or guarantee regarding the reliability, timeliness, quality, safety, suitability, or availability of any of the Transport Services or that they will be error free. I fully understand that traveling by vehicle involves risks and dangers of serious bodily injury, including permanent disability, paralysis, and death. I agree, on behalf of myself and on behalf of any minor child using the Transport Services for whom I am the parent or legal guardian, that the entire risk arising out of my use of the [employer] remains solely with me, to the maximum extent permitted under applicable law. The [employer] are provided "as  is" and "as available." [facility] disclaims all representations and warranties, express, implied or statutory, not expressly set out in these terms, including the implied warranties of merchantability and fitness for a particular purpose. I hereby waive and release [facility], its agents, employees, officers, directors, representatives, insurers, attorneys, assigns, successors, subsidiaries, and affiliates from any and all past, present, or future claims, demands, liabilities, actions, causes of action, or suits of any kind directly or indirectly arising from acceptance and use of the [employer]. I further waive and release [facility] and its affiliates from all present and future liability and responsibility for any injury or death to persons or damages to property caused by or related to the use of the [employer]. I have read this Waiver and Release of Liability, and I understand the terms used in it and their legal significance. This Waiver is freely and voluntarily given with the understanding that my right (as well as the right of any minor child for whom I am the parent or legal guardian using the [employer]) to legal recourse against [place] in connection with the [employer] is knowingly surrendered in return for use of these services.   I attest that I read the consent document to Tiffany Mejia, gave Ms. Tiffany Mejia the opportunity to ask questions and answered the questions asked (if any). I affirm that Tiffany Mejia then provided consent for she's participation in this program.     Tiffany Mejia

## 2021-01-25 DIAGNOSIS — G6289 Other specified polyneuropathies: Secondary | ICD-10-CM | POA: Diagnosis not present

## 2021-01-25 DIAGNOSIS — N3946 Mixed incontinence: Secondary | ICD-10-CM | POA: Diagnosis not present

## 2021-02-02 DIAGNOSIS — H26491 Other secondary cataract, right eye: Secondary | ICD-10-CM | POA: Diagnosis not present

## 2021-03-08 ENCOUNTER — Ambulatory Visit: Payer: PPO | Admitting: Physical Therapy

## 2021-03-15 DIAGNOSIS — L648 Other androgenic alopecia: Secondary | ICD-10-CM | POA: Diagnosis not present

## 2021-03-22 DIAGNOSIS — R109 Unspecified abdominal pain: Secondary | ICD-10-CM | POA: Diagnosis not present

## 2021-03-22 DIAGNOSIS — K589 Irritable bowel syndrome without diarrhea: Secondary | ICD-10-CM | POA: Diagnosis not present

## 2021-03-28 ENCOUNTER — Ambulatory Visit: Payer: PPO | Admitting: Physical Therapy

## 2021-04-07 DIAGNOSIS — M180 Bilateral primary osteoarthritis of first carpometacarpal joints: Secondary | ICD-10-CM | POA: Diagnosis not present

## 2021-07-22 DIAGNOSIS — M8580 Other specified disorders of bone density and structure, unspecified site: Secondary | ICD-10-CM | POA: Diagnosis not present

## 2021-07-22 DIAGNOSIS — E785 Hyperlipidemia, unspecified: Secondary | ICD-10-CM | POA: Diagnosis not present

## 2021-07-22 DIAGNOSIS — E559 Vitamin D deficiency, unspecified: Secondary | ICD-10-CM | POA: Diagnosis not present

## 2021-07-22 DIAGNOSIS — I10 Essential (primary) hypertension: Secondary | ICD-10-CM | POA: Diagnosis not present

## 2021-07-22 DIAGNOSIS — R1032 Left lower quadrant pain: Secondary | ICD-10-CM | POA: Diagnosis not present

## 2021-07-22 DIAGNOSIS — Z23 Encounter for immunization: Secondary | ICD-10-CM | POA: Diagnosis not present

## 2021-07-22 DIAGNOSIS — E538 Deficiency of other specified B group vitamins: Secondary | ICD-10-CM | POA: Diagnosis not present

## 2021-07-22 DIAGNOSIS — Z0000 Encounter for general adult medical examination without abnormal findings: Secondary | ICD-10-CM | POA: Diagnosis not present

## 2021-07-26 ENCOUNTER — Other Ambulatory Visit: Payer: Self-pay | Admitting: Family Medicine

## 2021-07-26 DIAGNOSIS — R1032 Left lower quadrant pain: Secondary | ICD-10-CM

## 2021-07-27 ENCOUNTER — Other Ambulatory Visit: Payer: Self-pay | Admitting: Family Medicine

## 2021-07-27 DIAGNOSIS — M8589 Other specified disorders of bone density and structure, multiple sites: Secondary | ICD-10-CM

## 2021-08-22 ENCOUNTER — Ambulatory Visit
Admission: RE | Admit: 2021-08-22 | Discharge: 2021-08-22 | Disposition: A | Discharge status: Home / Self Care | Payer: PPO | Source: Ambulatory Visit | Attending: Family Medicine | Admitting: Family Medicine

## 2021-08-22 DIAGNOSIS — I700 Atherosclerosis of aorta: Secondary | ICD-10-CM | POA: Diagnosis not present

## 2021-08-22 DIAGNOSIS — J9811 Atelectasis: Secondary | ICD-10-CM | POA: Diagnosis not present

## 2021-08-22 DIAGNOSIS — R1032 Left lower quadrant pain: Secondary | ICD-10-CM

## 2021-08-22 DIAGNOSIS — N281 Cyst of kidney, acquired: Secondary | ICD-10-CM | POA: Diagnosis not present

## 2021-08-22 DIAGNOSIS — Z9049 Acquired absence of other specified parts of digestive tract: Secondary | ICD-10-CM | POA: Diagnosis not present

## 2021-08-22 MED ORDER — IOPAMIDOL (ISOVUE-300) INJECTION 61%
100.0000 mL | Freq: Once | INTRAVENOUS | Status: AC | PRN
  Administered 2021-08-22: 100 mL via INTRAVENOUS

## 2021-09-08 DIAGNOSIS — H353124 Nonexudative age-related macular degeneration, left eye, advanced atrophic with subfoveal involvement: Secondary | ICD-10-CM | POA: Diagnosis not present

## 2021-09-08 DIAGNOSIS — H353114 Nonexudative age-related macular degeneration, right eye, advanced atrophic with subfoveal involvement: Secondary | ICD-10-CM | POA: Diagnosis not present

## 2021-09-08 DIAGNOSIS — H353134 Nonexudative age-related macular degeneration, bilateral, advanced atrophic with subfoveal involvement: Secondary | ICD-10-CM | POA: Diagnosis not present

## 2021-09-08 DIAGNOSIS — Z961 Presence of intraocular lens: Secondary | ICD-10-CM | POA: Diagnosis not present

## 2021-09-20 ENCOUNTER — Other Ambulatory Visit: Payer: PPO

## 2021-09-30 DIAGNOSIS — L02511 Cutaneous abscess of right hand: Secondary | ICD-10-CM | POA: Diagnosis not present

## 2021-09-30 DIAGNOSIS — L03113 Cellulitis of right upper limb: Secondary | ICD-10-CM | POA: Diagnosis not present

## 2021-10-06 ENCOUNTER — Other Ambulatory Visit: Payer: Self-pay | Admitting: Family Medicine

## 2021-10-06 DIAGNOSIS — R1010 Upper abdominal pain, unspecified: Secondary | ICD-10-CM

## 2021-10-06 DIAGNOSIS — M7989 Other specified soft tissue disorders: Secondary | ICD-10-CM | POA: Diagnosis not present

## 2021-10-06 DIAGNOSIS — Z8249 Family history of ischemic heart disease and other diseases of the circulatory system: Secondary | ICD-10-CM

## 2021-10-06 DIAGNOSIS — I700 Atherosclerosis of aorta: Secondary | ICD-10-CM | POA: Diagnosis not present

## 2021-10-06 DIAGNOSIS — L539 Erythematous condition, unspecified: Secondary | ICD-10-CM | POA: Diagnosis not present

## 2021-10-11 ENCOUNTER — Ambulatory Visit
Admission: RE | Admit: 2021-10-11 | Discharge: 2021-10-11 | Disposition: A | Discharge status: Home / Self Care | Payer: PPO | Source: Ambulatory Visit | Attending: Family Medicine | Admitting: Family Medicine

## 2021-10-11 DIAGNOSIS — I2584 Coronary atherosclerosis due to calcified coronary lesion: Secondary | ICD-10-CM | POA: Diagnosis not present

## 2021-10-11 DIAGNOSIS — R1010 Upper abdominal pain, unspecified: Secondary | ICD-10-CM

## 2021-10-11 DIAGNOSIS — Z8249 Family history of ischemic heart disease and other diseases of the circulatory system: Secondary | ICD-10-CM

## 2021-10-18 DIAGNOSIS — M19049 Primary osteoarthritis, unspecified hand: Secondary | ICD-10-CM | POA: Diagnosis not present

## 2021-12-21 DIAGNOSIS — M79641 Pain in right hand: Secondary | ICD-10-CM | POA: Diagnosis not present

## 2021-12-21 DIAGNOSIS — M79642 Pain in left hand: Secondary | ICD-10-CM | POA: Diagnosis not present

## 2021-12-21 DIAGNOSIS — Z23 Encounter for immunization: Secondary | ICD-10-CM | POA: Diagnosis not present

## 2021-12-21 DIAGNOSIS — M19049 Primary osteoarthritis, unspecified hand: Secondary | ICD-10-CM | POA: Diagnosis not present

## 2022-01-03 DIAGNOSIS — M1812 Unilateral primary osteoarthritis of first carpometacarpal joint, left hand: Secondary | ICD-10-CM | POA: Diagnosis not present

## 2022-01-03 DIAGNOSIS — M1811 Unilateral primary osteoarthritis of first carpometacarpal joint, right hand: Secondary | ICD-10-CM | POA: Diagnosis not present

## 2022-01-11 ENCOUNTER — Other Ambulatory Visit: Payer: PPO

## 2022-01-18 DIAGNOSIS — I700 Atherosclerosis of aorta: Secondary | ICD-10-CM | POA: Diagnosis not present

## 2022-01-18 DIAGNOSIS — M19049 Primary osteoarthritis, unspecified hand: Secondary | ICD-10-CM | POA: Diagnosis not present

## 2022-01-18 DIAGNOSIS — E785 Hyperlipidemia, unspecified: Secondary | ICD-10-CM | POA: Diagnosis not present

## 2022-01-18 DIAGNOSIS — R21 Rash and other nonspecific skin eruption: Secondary | ICD-10-CM | POA: Diagnosis not present

## 2022-01-31 DIAGNOSIS — M1811 Unilateral primary osteoarthritis of first carpometacarpal joint, right hand: Secondary | ICD-10-CM | POA: Diagnosis not present

## 2022-01-31 DIAGNOSIS — M1812 Unilateral primary osteoarthritis of first carpometacarpal joint, left hand: Secondary | ICD-10-CM | POA: Diagnosis not present

## 2022-03-02 DIAGNOSIS — N3001 Acute cystitis with hematuria: Secondary | ICD-10-CM | POA: Diagnosis not present

## 2022-03-02 DIAGNOSIS — R21 Rash and other nonspecific skin eruption: Secondary | ICD-10-CM | POA: Diagnosis not present

## 2022-03-06 DIAGNOSIS — B028 Zoster with other complications: Secondary | ICD-10-CM | POA: Diagnosis not present

## 2022-03-08 DIAGNOSIS — B029 Zoster without complications: Secondary | ICD-10-CM | POA: Diagnosis not present

## 2022-03-15 DIAGNOSIS — H524 Presbyopia: Secondary | ICD-10-CM | POA: Diagnosis not present

## 2022-03-15 DIAGNOSIS — H52203 Unspecified astigmatism, bilateral: Secondary | ICD-10-CM | POA: Diagnosis not present

## 2022-03-15 DIAGNOSIS — H5203 Hypermetropia, bilateral: Secondary | ICD-10-CM | POA: Diagnosis not present

## 2022-05-04 DIAGNOSIS — H353114 Nonexudative age-related macular degeneration, right eye, advanced atrophic with subfoveal involvement: Secondary | ICD-10-CM | POA: Diagnosis not present

## 2022-05-04 DIAGNOSIS — H353134 Nonexudative age-related macular degeneration, bilateral, advanced atrophic with subfoveal involvement: Secondary | ICD-10-CM | POA: Diagnosis not present

## 2022-05-04 DIAGNOSIS — Z961 Presence of intraocular lens: Secondary | ICD-10-CM | POA: Diagnosis not present

## 2022-05-04 DIAGNOSIS — H353124 Nonexudative age-related macular degeneration, left eye, advanced atrophic with subfoveal involvement: Secondary | ICD-10-CM | POA: Diagnosis not present

## 2022-06-23 DIAGNOSIS — R42 Dizziness and giddiness: Secondary | ICD-10-CM | POA: Diagnosis not present

## 2022-06-23 DIAGNOSIS — R300 Dysuria: Secondary | ICD-10-CM | POA: Diagnosis not present

## 2022-07-03 DIAGNOSIS — K080 Exfoliation of teeth due to systemic causes: Secondary | ICD-10-CM | POA: Diagnosis not present

## 2022-08-01 DIAGNOSIS — H524 Presbyopia: Secondary | ICD-10-CM | POA: Diagnosis not present

## 2022-08-04 DIAGNOSIS — M8580 Other specified disorders of bone density and structure, unspecified site: Secondary | ICD-10-CM | POA: Diagnosis not present

## 2022-08-04 DIAGNOSIS — E785 Hyperlipidemia, unspecified: Secondary | ICD-10-CM | POA: Diagnosis not present

## 2022-08-04 DIAGNOSIS — R5383 Other fatigue: Secondary | ICD-10-CM | POA: Diagnosis not present

## 2022-08-04 DIAGNOSIS — Z0000 Encounter for general adult medical examination without abnormal findings: Secondary | ICD-10-CM | POA: Diagnosis not present

## 2022-08-04 DIAGNOSIS — E559 Vitamin D deficiency, unspecified: Secondary | ICD-10-CM | POA: Diagnosis not present

## 2022-08-04 DIAGNOSIS — I700 Atherosclerosis of aorta: Secondary | ICD-10-CM | POA: Diagnosis not present

## 2022-08-04 DIAGNOSIS — I10 Essential (primary) hypertension: Secondary | ICD-10-CM | POA: Diagnosis not present

## 2022-08-04 DIAGNOSIS — E538 Deficiency of other specified B group vitamins: Secondary | ICD-10-CM | POA: Diagnosis not present

## 2022-11-07 DIAGNOSIS — M4800 Spinal stenosis, site unspecified: Secondary | ICD-10-CM | POA: Diagnosis not present

## 2022-11-07 DIAGNOSIS — H547 Unspecified visual loss: Secondary | ICD-10-CM | POA: Diagnosis not present

## 2022-11-07 DIAGNOSIS — Z23 Encounter for immunization: Secondary | ICD-10-CM | POA: Diagnosis not present

## 2022-11-07 DIAGNOSIS — I10 Essential (primary) hypertension: Secondary | ICD-10-CM | POA: Diagnosis not present

## 2022-11-07 DIAGNOSIS — G6289 Other specified polyneuropathies: Secondary | ICD-10-CM | POA: Diagnosis not present

## 2022-11-10 DIAGNOSIS — R278 Other lack of coordination: Secondary | ICD-10-CM | POA: Diagnosis not present

## 2022-11-10 DIAGNOSIS — G629 Polyneuropathy, unspecified: Secondary | ICD-10-CM | POA: Diagnosis not present

## 2022-11-14 DIAGNOSIS — R278 Other lack of coordination: Secondary | ICD-10-CM | POA: Diagnosis not present

## 2022-11-14 DIAGNOSIS — G629 Polyneuropathy, unspecified: Secondary | ICD-10-CM | POA: Diagnosis not present

## 2022-11-17 DIAGNOSIS — R278 Other lack of coordination: Secondary | ICD-10-CM | POA: Diagnosis not present

## 2022-11-17 DIAGNOSIS — G629 Polyneuropathy, unspecified: Secondary | ICD-10-CM | POA: Diagnosis not present

## 2022-11-20 DIAGNOSIS — G629 Polyneuropathy, unspecified: Secondary | ICD-10-CM | POA: Diagnosis not present

## 2022-11-20 DIAGNOSIS — R278 Other lack of coordination: Secondary | ICD-10-CM | POA: Diagnosis not present

## 2022-11-24 DIAGNOSIS — R278 Other lack of coordination: Secondary | ICD-10-CM | POA: Diagnosis not present

## 2022-11-24 DIAGNOSIS — G629 Polyneuropathy, unspecified: Secondary | ICD-10-CM | POA: Diagnosis not present

## 2022-11-27 DIAGNOSIS — G629 Polyneuropathy, unspecified: Secondary | ICD-10-CM | POA: Diagnosis not present

## 2022-11-27 DIAGNOSIS — R278 Other lack of coordination: Secondary | ICD-10-CM | POA: Diagnosis not present

## 2022-12-01 DIAGNOSIS — G629 Polyneuropathy, unspecified: Secondary | ICD-10-CM | POA: Diagnosis not present

## 2022-12-01 DIAGNOSIS — R278 Other lack of coordination: Secondary | ICD-10-CM | POA: Diagnosis not present

## 2022-12-06 ENCOUNTER — Ambulatory Visit: Payer: PPO | Admitting: Obstetrics and Gynecology

## 2022-12-08 DIAGNOSIS — G629 Polyneuropathy, unspecified: Secondary | ICD-10-CM | POA: Diagnosis not present

## 2022-12-08 DIAGNOSIS — R278 Other lack of coordination: Secondary | ICD-10-CM | POA: Diagnosis not present

## 2022-12-12 DIAGNOSIS — G629 Polyneuropathy, unspecified: Secondary | ICD-10-CM | POA: Diagnosis not present

## 2022-12-12 DIAGNOSIS — R278 Other lack of coordination: Secondary | ICD-10-CM | POA: Diagnosis not present

## 2022-12-14 DIAGNOSIS — R278 Other lack of coordination: Secondary | ICD-10-CM | POA: Diagnosis not present

## 2022-12-14 DIAGNOSIS — G629 Polyneuropathy, unspecified: Secondary | ICD-10-CM | POA: Diagnosis not present

## 2022-12-18 ENCOUNTER — Encounter: Payer: Self-pay | Admitting: Obstetrics and Gynecology

## 2022-12-18 ENCOUNTER — Ambulatory Visit (INDEPENDENT_AMBULATORY_CARE_PROVIDER_SITE_OTHER): Payer: PPO | Admitting: Obstetrics and Gynecology

## 2022-12-18 ENCOUNTER — Other Ambulatory Visit (HOSPITAL_COMMUNITY)
Admission: RE | Admit: 2022-12-18 | Discharge: 2022-12-18 | Disposition: A | Discharge status: Home / Self Care | Payer: Medicare Other | Source: Other Acute Inpatient Hospital | Attending: Obstetrics and Gynecology | Admitting: Obstetrics and Gynecology

## 2022-12-18 VITALS — BP 158/92 | HR 66 | Ht 64.0 in | Wt 133.4 lb

## 2022-12-18 DIAGNOSIS — N952 Postmenopausal atrophic vaginitis: Secondary | ICD-10-CM | POA: Diagnosis not present

## 2022-12-18 DIAGNOSIS — R350 Frequency of micturition: Secondary | ICD-10-CM | POA: Diagnosis not present

## 2022-12-18 DIAGNOSIS — R82998 Other abnormal findings in urine: Secondary | ICD-10-CM

## 2022-12-18 DIAGNOSIS — N3281 Overactive bladder: Secondary | ICD-10-CM

## 2022-12-18 DIAGNOSIS — L900 Lichen sclerosus et atrophicus: Secondary | ICD-10-CM | POA: Diagnosis not present

## 2022-12-18 DIAGNOSIS — K5904 Chronic idiopathic constipation: Secondary | ICD-10-CM

## 2022-12-18 LAB — POCT URINALYSIS DIPSTICK
Bilirubin, UA: NEGATIVE
Glucose, UA: NEGATIVE
Ketones, UA: NEGATIVE
Nitrite, UA: POSITIVE
Protein, UA: POSITIVE — AB
Spec Grav, UA: >=1.03 — AB (ref 1.010–1.025)
Urobilinogen, UA: 0.2 U/dL
pH, UA: 5.5 (ref 5.0–8.0)

## 2022-12-18 MED ORDER — TROSPIUM CHLORIDE ER 60 MG PO CP24: 1.0000 | ORAL_CAPSULE | Freq: Every day | ORAL | 5 refills | Status: DC

## 2022-12-18 MED ORDER — ESTRADIOL 0.1 MG/GM VA CREA: TOPICAL_CREAM | VAGINAL | 11 refills | Status: AC

## 2022-12-18 MED ORDER — CLOBETASOL PROPIONATE E 0.05 % EX CREA: TOPICAL_CREAM | CUTANEOUS | 11 refills | Status: AC

## 2022-12-18 NOTE — Patient Instructions (Addendum)
Constipation: Our goal is to achieve formed bowel movements daily or every-other-day.  You may need to try different combinations of the following options to find what works best for you - everybody's body works differently so feel free to adjust the dosages as needed.  Some options to help maintain bowel health include:  Dietary changes (more leafy greens, vegetables and fruits; less processed foods) Fiber supplementation (Benefiber, FiberCon, Metamucil or Psyllium). Start slow and increase gradually to full dose. Over-the-counter agents such as: stool softeners (Docusate or Colace) and/or laxatives (Miralax, milk of magnesia). Start miralax and stool softener every day. Can decrease to a small amount of miralax daily if needed "Power Pudding" is a natural mixture that may help your constipation.  To make blend 1 cup applesauce, 1 cup wheat bran, and 3/4 cup prune juice, refrigerate and then take 1 tablespoon daily with a large glass of water as needed.   Today we talked about ways to manage bladder urgency such as altering your diet to avoid irritative beverages and foods (bladder diet) as well as attempting to decrease stress and other exacerbating factors.    Avoid drinking 2-3 hours prior to bedtime.  The Most Bothersome Foods* The Least Bothersome Foods*  Coffee - Regular & Decaf Tea - caffeinated Carbonated beverages - cola, non-colas, diet & caffeine-free Alcohols - Beer, Red Wine, White Wine, [address] - Grapefruit, [place], Orange, [employer] - Cranberry, Grapefruit, Orange, Pineapple Vegetables - Tomato & Tomato Products Flavor Enhancers - Hot peppers, Spicy foods, Chili, Horseradish, Vinegar, Monosodium glutamate (MSG) Artificial Sweeteners - NutraSweet, Sweet 'N Low, Equal (sweetener), Saccharin Ethnic foods - [place], [place], [place] food [employer] - low-fat & whole Fruits - Bananas, Blueberries, Honeydew melon, Pears, Raisins, Watermelon Vegetables - Broccoli,  [address] Sprouts, [place], Carrots, Cauliflower, [place], Cucumber, Mushrooms, Peas, Radishes, Squash, Zucchini, White potatoes, Sweet potatoes & yams Poultry - Chicken, Eggs, [place], [employer] - Beef, Diplomatic Services operational officer, Lamb Seafood - Shrimp, [place] fish, Salmon Grains - Oat, Rice Snacks - Pretzels, Popcorn  *Lenward Chancellor et al. Diet and its role in interstitial cystitis/bladder pain syndrome (IC/BPS) and comorbid conditions. [employer]. BJU Int. 2012 Jan 11.   1- Use clobetasol over the labia and near the rectum three times a week- Monday, Wednesday, and Friday 2- Use estrogen cream- a pea sized amount- with your finger into the vaginal two times a week- Tues and Thursday

## 2022-12-18 NOTE — Progress Notes (Signed)
New Patient Evaluation and Consultation  Referring Provider: Shirlean Mylar, MD PCP: Shirlean Mylar, MD Date of Service: 12/18/2022  SUBJECTIVE Chief Complaint: New Patient (Initial Visit) Tiffany Mejia is a 87 y.o. female here for a consult for incontinence.)  History of Present Illness: Tiffany Mejia is a 87 y.o. White or Caucasian female presenting for evaluation of incontinence.     Urinary Symptoms: Leaks urine with with a full bladder, with movement to the bathroom, and while asleep. Also leakage with exercise, cough or sneeze.  Feels "damp" all day, sometimes has a large amount of leakage.  Pad use: 2- 3 liners/ mini-pads per day.   Patient is bothered by UI symptoms. Has never been on a medication for her bladder  Day time voids 6-7.  Nocturia: 3 times per night to void. Voiding dysfunction:  does not empty bladder well.  Patient does not use a catheter to empty bladder.  When urinating, patient feels she has no difficulties Drinks: 1 cup coffee in AM, 1/3 bottle coke zero, apple juice, glass tea with dinner, 8oz water per day, Chardonnay at night. Occasional gatorade  UTIs: 2 UTI's in the last year.  Denies any current symptoms Denies history of blood in urine and kidney or bladder stones No results found for the last 90 days.   Pelvic Organ Prolapse Symptoms:                  Patient Denies a feeling of a bulge the vaginal area.   Bowel Symptom: Bowel movements: every 3 days Stool consistency: hard Straining: yes.  Splinting: yes.  Incomplete evacuation: no.  Patient Admits to accidental bowel leakage / fecal incontinence  Occurs: only with looser stool- has been better recently  Consistency with leakage: liquid Bowel regimen: stool softener and miralax Last colonoscopy: Date 2012- negative  Sexual Function Sexually active: no.   Pelvic Pain Denies pelvic pain  She has lichen sclerosus and has noticed that her tissue is growing together. Last seen  someone about 5 years ago. She has a lot of irritation.  She no longer has estradiol cream.  She is using the clobetasol as needed when she has irritation but not consistent.   Past Medical History:  Past Medical History:  Diagnosis Date   Angiokeratoma    Cervical spondylosis without myelopathy    Depression    GERD (gastroesophageal reflux disease)    History of basal cell carcinoma    HLD (hyperlipidemia)    Hypertension    IBS (irritable bowel syndrome)    Lichen sclerosus of female genitalia    Macular degeneration    Nocturnal leg cramps    Vertigo    Vitamin B12 deficiency      Past Surgical History:   Past Surgical History:  Procedure Laterality Date   APPENDECTOMY     CATARACT EXTRACTION Right    CERVICAL SPINE SURGERY     gallbladder resection     TONSILLECTOMY       Past OB/GYN History: OB History  Gravida Para Term Preterm AB Living  5 5 5     5   SAB IAB Ectopic Multiple Live Births          1    # Outcome Date GA Lbr Len/2nd Weight Sex Type Anes PTL Lv  5 Term      Vag-Spont     4 Term      Vag-Spont     3 Term      Vag-Spont  2 Term      Vag-Spont     1 Term      Vag-Forceps   LIV    Menopausal: Denies vaginal bleeding since menopause    Medications: Patient has a current medication list which includes the following prescription(s): vitamin d3, clobetasol ointment, clobetasol propionate e, estradiol, estrogens (conjugated), melatonin, thiamine, trospium chloride, and cyanocobalamin.   Allergies: Patient is allergic to statins and nexium [esomeprazole magnesium].   Social History:  Social History   Tobacco Use   Smoking status: Former    Current packs/day: 0.00    Types: Cigarettes    Quit date: 03/07/1995    Years since quitting: 27.8   Smokeless tobacco: Never   Tobacco comments:    quit Feb 1997  Vaping Use   Vaping status: Never Used  Substance Use Topics   Alcohol use: Yes    Comment: occassional 2 glasses of wine nightly    Drug use: No    Relationship status: widowed Lives at [facility] senior facility Patient is not employed. Regular exercise: No History of abuse: No  Family History:   Family History  Problem Relation Age of Onset   Heart disease Father    Stroke Sister    Neuropathy Sister    Hypertension Brother    Stroke Brother      Review of Systems: Review of Systems  Constitutional:  Negative for fever, malaise/fatigue and weight loss.  Respiratory:  Negative for cough, shortness of breath and wheezing.   Cardiovascular:  Negative for chest pain, palpitations and leg swelling.  Gastrointestinal:  Negative for abdominal pain and blood in stool.  Genitourinary:  Negative for dysuria.  Musculoskeletal:  Negative for myalgias.  Skin:  Negative for rash.  Neurological:  Negative for dizziness and headaches.  Endo/Heme/Allergies:  Does not bruise/bleed easily.  Psychiatric/Behavioral:  Negative for depression. The patient is not nervous/anxious.      OBJECTIVE Physical Exam: Vitals:   12/18/22 1100  BP: (!) 158/92  Pulse: 66  Weight: 133 lb 6.4 oz (60.5 kg)  Height: 5\' 4"  (1.626 m)    Physical Exam Constitutional:      General: She is not in acute distress. Pulmonary:     Effort: Pulmonary effort is normal.  Abdominal:     General: There is no distension.     Palpations: Abdomen is soft.     Tenderness: There is no abdominal tenderness. There is no rebound.  Musculoskeletal:        General: No swelling. Normal range of motion.  Skin:    General: Skin is warm and dry.     Findings: No rash.  Neurological:     Mental Status: She is alert and oriented to person, place, and time.  Psychiatric:        Mood and Affect: Mood normal.        Behavior: Behavior normal.      GU / Detailed Urogynecologic Evaluation:  Pelvic Exam: Lichen sclerosus noted over clitoral hood, labia minora and on the perineum; Bartholin's and Skene's glands normal in appearance; urethral meatus  normal in appearance, no urethral masses or discharge.   CST: negative Speculum exam reveals normal vaginal mucosa with atrophy. Cervix normal appearance. Uterus normal single, nontender. Adnexa no mass, fullness, tenderness.    Pelvic floor strength I/V  Pelvic floor musculature: Right levator non-tender, Right obturator non-tender, Left levator non-tender, Left obturator non-tender  POP-Q:  Deferred- no significant prolapse noted.   Rectal Exam:  Normal external  rectum  Post-Void Residual (PVR) by Bladder Scan: In order to evaluate bladder emptying, we discussed obtaining a postvoid residual and patient agreed to this procedure.  Procedure: The ultrasound unit was placed on the patient's abdomen in the suprapubic region after the patient had voided.    Post Void Residual - 12/18/22 1112       Post Void Residual   Post Void Residual 11 mL              Laboratory Results: Lab Results  Component Value Date   COLORU yellow 12/18/2022   CLARITYU cloudy 12/18/2022   GLUCOSEUR Negative 12/18/2022   BILIRUBINUR negative 12/18/2022   KETONESU negative 12/18/2022   SPECGRAV >=1.030 (A) 12/18/2022   RBCUR trace 12/18/2022   PHUR 5.5 12/18/2022   PROTEINUR Positive (A) 12/18/2022   UROBILINOGEN 0.2 12/18/2022   LEUKOCYTESUR Moderate (2+) (A) 12/18/2022      ASSESSMENT AND PLAN Ms. Halberstadt is a 87 y.o. with:  1. Overactive bladder   2. Urinary frequency   3. Lichen sclerosus   4. Vaginal atrophy   5. Leukocytes in urine   6. Chronic idiopathic constipation     OAB - We discussed the symptoms of overactive bladder (OAB), which include urinary urgency, urinary frequency, nocturia, with or without urge incontinence.  While we do not know the exact etiology of OAB, several treatment options exist. We discussed management including behavioral therapy (decreasing bladder irritants, urge suppression strategies, timed voids, bladder retraining), physical therapy,  medication. - She does not drive so PT may be difficult for her.  - Prescribed trospium 60mg  ER daily. For anticholinergic medications, we discussed the potential side effects of anticholinergics including dry eyes, dry mouth, constipation, cognitive impairment and urinary retention.   2. Lichen sclerosus/ atrophy - restart clobetasol over vulva and perineum three times a week- M, W, F - restart estrace in vagina twice a week- T, Th. We discussed this can also help prevent urinary tract infections.   3. Leukocytes in urine - not symptomatic other than increase frequency, so will send a culture and determine treatment based on results  4. Constipation/ Bowel leakage - For constipation, we reviewed the importance of a better bowel regimen.  We also discussed the importance of avoiding chronic straining, as it can exacerbate her pelvic floor symptoms.  - Start taking miralax daily to prevent constipation.  - We discussed that preventing constipation can also help prevent bowel leakage. Recommended also adding in daily fiber supplement to help soften stool.   Return 6 weeks or sooner if needed  Marguerita Beards, MD

## 2022-12-19 DIAGNOSIS — G629 Polyneuropathy, unspecified: Secondary | ICD-10-CM | POA: Diagnosis not present

## 2022-12-19 DIAGNOSIS — R278 Other lack of coordination: Secondary | ICD-10-CM | POA: Diagnosis not present

## 2022-12-20 LAB — URINE CULTURE: Culture: >=100000 — AB

## 2022-12-20 MED ORDER — SULFAMETHOXAZOLE-TRIMETHOPRIM 800-160 MG PO TABS: 1.0000 | ORAL_TABLET | Freq: Two times a day (BID) | ORAL | 0 refills | Status: AC

## 2022-12-20 NOTE — Addendum Note (Signed)
Addended by: Marguerita Beards on: 12/20/2022 12:06 PM   Modules accepted: Orders

## 2022-12-21 DIAGNOSIS — G629 Polyneuropathy, unspecified: Secondary | ICD-10-CM | POA: Diagnosis not present

## 2022-12-21 DIAGNOSIS — R278 Other lack of coordination: Secondary | ICD-10-CM | POA: Diagnosis not present

## 2023-01-02 DIAGNOSIS — G629 Polyneuropathy, unspecified: Secondary | ICD-10-CM | POA: Diagnosis not present

## 2023-01-02 DIAGNOSIS — R278 Other lack of coordination: Secondary | ICD-10-CM | POA: Diagnosis not present

## 2023-01-10 DIAGNOSIS — K080 Exfoliation of teeth due to systemic causes: Secondary | ICD-10-CM | POA: Diagnosis not present

## 2023-01-11 DIAGNOSIS — Z961 Presence of intraocular lens: Secondary | ICD-10-CM | POA: Diagnosis not present

## 2023-01-11 DIAGNOSIS — H353134 Nonexudative age-related macular degeneration, bilateral, advanced atrophic with subfoveal involvement: Secondary | ICD-10-CM | POA: Diagnosis not present

## 2023-01-29 DIAGNOSIS — D1801 Hemangioma of skin and subcutaneous tissue: Secondary | ICD-10-CM | POA: Diagnosis not present

## 2023-01-29 DIAGNOSIS — L309 Dermatitis, unspecified: Secondary | ICD-10-CM | POA: Diagnosis not present

## 2023-02-05 ENCOUNTER — Ambulatory Visit: Payer: Medicare Other | Admitting: Obstetrics and Gynecology

## 2023-02-05 ENCOUNTER — Encounter: Payer: Self-pay | Admitting: Obstetrics and Gynecology

## 2023-02-05 VITALS — BP 154/70 | HR 73

## 2023-02-05 DIAGNOSIS — L900 Lichen sclerosus et atrophicus: Secondary | ICD-10-CM | POA: Diagnosis not present

## 2023-02-05 DIAGNOSIS — N952 Postmenopausal atrophic vaginitis: Secondary | ICD-10-CM

## 2023-02-05 DIAGNOSIS — N3281 Overactive bladder: Secondary | ICD-10-CM | POA: Diagnosis not present

## 2023-02-05 MED ORDER — VIBEGRON 75 MG PO TABS
75.0000 mg | ORAL_TABLET | Freq: Every day | ORAL | 5 refills | Status: AC
Start: 2023-02-05 — End: ?

## 2023-02-05 NOTE — Patient Instructions (Addendum)
Stop the Trospium. Change to Gemtesa 75mg  daily. I have given you samples. You will take one daily.   Start Miralax and Metamucil for your bowels.   Try to do the steroid cream M-W-F and the estrogen on Tuesday and Thursday.

## 2023-02-05 NOTE — Progress Notes (Signed)
[facility] Return Visit  SUBJECTIVE  History of Present Illness: RAVYNN SCHWEIGHARDT is a 87 y.o. female seen in follow-up for OAB, Lichen's sclerosus, vaginal atrophy, and constipation. Plan at last visit was patient was to start trospium 60 mg ER daily for overactive bladder.  Use steroid cream for lichen sclerosis Monday Wednesday and Friday weekly, and use estrogen cream Tuesdays and Thursdays.  Patient reports she has had symptoms of significant dry mouth, dry eyes, and constipation.  She also reports that she had confusion on how to use the elevator today which is abnormal for her.    Past Medical History: Patient  has a past medical history of Angiokeratoma, Cervical spondylosis without myelopathy, Depression, GERD (gastroesophageal reflux disease), History of basal cell carcinoma, HLD (hyperlipidemia), Hypertension, IBS (irritable bowel syndrome), Lichen sclerosus of female genitalia, Macular degeneration, Nocturnal leg cramps, Vertigo, and Vitamin B12 deficiency.   Past Surgical History: She  has a past surgical history that includes Cataract extraction (Right); Cervical spine surgery; gallbladder resection; Tonsillectomy; and Appendectomy.   Medications: She has a current medication list which includes the following prescription(s): vitamin d3, clobetasol ointment, clobetasol propionate e, estradiol, estrogens (conjugated), melatonin, thiamine, vibegron, and cyanocobalamin.   Allergies: Patient is allergic to statins and nexium [esomeprazole magnesium].   Social History: Patient  reports that she quit smoking about 27 years ago. Her smoking use included cigarettes. She has never used smokeless tobacco. She reports current alcohol use. She reports that she does not use drugs.      OBJECTIVE     Physical Exam: Vitals:   02/05/23 1025  BP: (!) 154/70  Pulse: 73   Gen: No apparent distress, A&O x 3.  Detailed Urogynecologic Evaluation:  Deferred.    ASSESSMENT AND PLAN    Ms. Bradney is a 87 y.o. with:  1. Overactive bladder   2. Vaginal atrophy   3. Lichen sclerosus    For patient's overactive bladder we will change her from trospium 60 mg ER daily to Gemtesa 75 mg daily.  Samples given to patient today.  Estimated cost is $47 a month.  Concern for patient's side effects profile including brain fog, memory impairment, constipation, dry mouth, and dry eyes.  Would not recommend starting patient on another anticholinergic medication based on the aforementioned side effects. Would not recommend Myrbetriq based on elevated blood pressure readings. Patient reports her vaginal atrophy has improved overall and that she is doing her best to use the cream twice weekly. Patient also endorses reduction in her itching and reports she works hard to do the steroid cream 3 times weekly.  Patient to follow-up in 4 to 6 weeks for medication follow-up or sooner if needed.  Encouraged patient to call if she has any concerning side effects.   Selmer Dominion, NP

## 2023-03-06 ENCOUNTER — Ambulatory Visit: Payer: Medicare Other | Admitting: Obstetrics and Gynecology

## 2023-03-08 ENCOUNTER — Ambulatory Visit: Payer: Medicare Other | Admitting: Obstetrics and Gynecology

## 2023-03-08 ENCOUNTER — Encounter: Payer: Self-pay | Admitting: Obstetrics and Gynecology

## 2023-03-08 VITALS — BP 129/71 | HR 66

## 2023-03-08 DIAGNOSIS — N3281 Overactive bladder: Secondary | ICD-10-CM | POA: Diagnosis not present

## 2023-03-08 DIAGNOSIS — L900 Lichen sclerosus et atrophicus: Secondary | ICD-10-CM | POA: Diagnosis not present

## 2023-03-08 DIAGNOSIS — N952 Postmenopausal atrophic vaginitis: Secondary | ICD-10-CM | POA: Diagnosis not present

## 2023-03-08 NOTE — Progress Notes (Signed)
 [facility] Return Visit  SUBJECTIVE  History of Present Illness: DAURICE OVANDO is a 88 y.o. female seen in follow-up for Lichen's sclerosus and OAB. Plan at last visit was start Gemtesa  75mg  daily and use the steroid cream x3 weekly for her Lichen's sclerosus.   Patient reports that she has been doing well on her treatment regimen for her lichen sclerosis and is using the steroid cream x 3 weekly.  Patient reports that she has also been doing well on the Gemtesa  75 mg daily with no concerning side effects at this time.  Patient also reports that her overall overactive bladder symptoms have decreased and that she has had nights where she slept greater than 5 hours which was a drastic improvement for her.   Past Medical History: Patient  has a past medical history of Angiokeratoma, Cervical spondylosis without myelopathy, Depression, GERD (gastroesophageal reflux disease), History of basal cell carcinoma, HLD (hyperlipidemia), Hypertension, IBS (irritable bowel syndrome), Lichen sclerosus of female genitalia, Macular degeneration, Nocturnal leg cramps, Vertigo, and Vitamin B12 deficiency.   Past Surgical History: She  has a past surgical history that includes Cataract extraction (Right); Cervical spine surgery; gallbladder resection; Tonsillectomy; and Appendectomy.   Medications: She has a current medication list which includes the following prescription(s): vitamin d3, clobetasol  ointment, clobetasol  propionate e, estradiol , estrogens (conjugated), melatonin, thiamine, vibegron , and cyanocobalamin .   Allergies: Patient is allergic to statins and nexium [esomeprazole magnesium ].   Social History: Patient  reports that she quit smoking about 28 years ago. Her smoking use included cigarettes. She has never used smokeless tobacco. She reports current alcohol use. She reports that she does not use drugs.     OBJECTIVE     Physical Exam: Vitals:   03/08/23 1039 03/08/23  1042 03/08/23 1204  BP: (!) 146/74 (!) 159/74 129/71  Pulse: 62 67 66   Gen: No apparent distress, A&O x 3.  Detailed Urogynecologic Evaluation:  Deferred.    ASSESSMENT AND PLAN    Ms. Youngren is a 88 y.o. with:  1. Overactive bladder   2. Vaginal atrophy   3. Lichen sclerosus    Patient's overactive bladder we will continue the Gemtesa  75 mg daily. For patient's vaginal atrophy encouraged her to continue doing estrogen cream twice weekly.  She reported that she has been doing well on this regimen and that her itching has seemed to do better. Patient to continue to on clobetasol  cream 3 times weekly to prevent spreading of lichen sclerosis.  She reports she has been adherent to this regimen and that she has everything written down but she is concerned as her eyesight has continued to worsen.  She has macular degeneration that has made her vision more difficult.  Will plan for patient to follow-up in 6 months or sooner if needed.  Encouraged patient to call if she has any questions or concerns   Skyra Crichlow G Kinzlee Selvy, NP

## 2023-03-23 DIAGNOSIS — R059 Cough, unspecified: Secondary | ICD-10-CM | POA: Diagnosis not present

## 2023-03-23 DIAGNOSIS — J019 Acute sinusitis, unspecified: Secondary | ICD-10-CM | POA: Diagnosis not present

## 2023-04-03 DIAGNOSIS — L309 Dermatitis, unspecified: Secondary | ICD-10-CM | POA: Diagnosis not present

## 2023-04-03 DIAGNOSIS — D1801 Hemangioma of skin and subcutaneous tissue: Secondary | ICD-10-CM | POA: Diagnosis not present

## 2023-04-20 DIAGNOSIS — M6281 Muscle weakness (generalized): Secondary | ICD-10-CM | POA: Diagnosis not present

## 2023-04-20 DIAGNOSIS — G629 Polyneuropathy, unspecified: Secondary | ICD-10-CM | POA: Diagnosis not present

## 2023-04-24 DIAGNOSIS — M6281 Muscle weakness (generalized): Secondary | ICD-10-CM | POA: Diagnosis not present

## 2023-04-24 DIAGNOSIS — G629 Polyneuropathy, unspecified: Secondary | ICD-10-CM | POA: Diagnosis not present

## 2023-04-27 DIAGNOSIS — M6281 Muscle weakness (generalized): Secondary | ICD-10-CM | POA: Diagnosis not present

## 2023-04-27 DIAGNOSIS — G629 Polyneuropathy, unspecified: Secondary | ICD-10-CM | POA: Diagnosis not present

## 2023-05-01 DIAGNOSIS — G629 Polyneuropathy, unspecified: Secondary | ICD-10-CM | POA: Diagnosis not present

## 2023-05-01 DIAGNOSIS — M6281 Muscle weakness (generalized): Secondary | ICD-10-CM | POA: Diagnosis not present

## 2023-05-04 DIAGNOSIS — M6281 Muscle weakness (generalized): Secondary | ICD-10-CM | POA: Diagnosis not present

## 2023-05-04 DIAGNOSIS — G629 Polyneuropathy, unspecified: Secondary | ICD-10-CM | POA: Diagnosis not present

## 2023-05-08 DIAGNOSIS — G629 Polyneuropathy, unspecified: Secondary | ICD-10-CM | POA: Diagnosis not present

## 2023-05-08 DIAGNOSIS — M6281 Muscle weakness (generalized): Secondary | ICD-10-CM | POA: Diagnosis not present

## 2023-05-15 DIAGNOSIS — G629 Polyneuropathy, unspecified: Secondary | ICD-10-CM | POA: Diagnosis not present

## 2023-05-15 DIAGNOSIS — M6281 Muscle weakness (generalized): Secondary | ICD-10-CM | POA: Diagnosis not present

## 2023-05-17 DIAGNOSIS — M6281 Muscle weakness (generalized): Secondary | ICD-10-CM | POA: Diagnosis not present

## 2023-05-17 DIAGNOSIS — G629 Polyneuropathy, unspecified: Secondary | ICD-10-CM | POA: Diagnosis not present

## 2023-05-24 DIAGNOSIS — M6281 Muscle weakness (generalized): Secondary | ICD-10-CM | POA: Diagnosis not present

## 2023-05-24 DIAGNOSIS — G629 Polyneuropathy, unspecified: Secondary | ICD-10-CM | POA: Diagnosis not present

## 2023-05-29 DIAGNOSIS — M6281 Muscle weakness (generalized): Secondary | ICD-10-CM | POA: Diagnosis not present

## 2023-05-29 DIAGNOSIS — G629 Polyneuropathy, unspecified: Secondary | ICD-10-CM | POA: Diagnosis not present

## 2023-05-31 DIAGNOSIS — M6281 Muscle weakness (generalized): Secondary | ICD-10-CM | POA: Diagnosis not present

## 2023-05-31 DIAGNOSIS — G629 Polyneuropathy, unspecified: Secondary | ICD-10-CM | POA: Diagnosis not present

## 2023-06-05 DIAGNOSIS — M6281 Muscle weakness (generalized): Secondary | ICD-10-CM | POA: Diagnosis not present

## 2023-06-05 DIAGNOSIS — G629 Polyneuropathy, unspecified: Secondary | ICD-10-CM | POA: Diagnosis not present

## 2023-06-07 DIAGNOSIS — G629 Polyneuropathy, unspecified: Secondary | ICD-10-CM | POA: Diagnosis not present

## 2023-06-07 DIAGNOSIS — M6281 Muscle weakness (generalized): Secondary | ICD-10-CM | POA: Diagnosis not present

## 2023-06-14 DIAGNOSIS — G629 Polyneuropathy, unspecified: Secondary | ICD-10-CM | POA: Diagnosis not present

## 2023-06-14 DIAGNOSIS — M6281 Muscle weakness (generalized): Secondary | ICD-10-CM | POA: Diagnosis not present

## 2023-06-21 DIAGNOSIS — G629 Polyneuropathy, unspecified: Secondary | ICD-10-CM | POA: Diagnosis not present

## 2023-06-21 DIAGNOSIS — M6281 Muscle weakness (generalized): Secondary | ICD-10-CM | POA: Diagnosis not present

## 2023-06-25 DIAGNOSIS — G629 Polyneuropathy, unspecified: Secondary | ICD-10-CM | POA: Diagnosis not present

## 2023-06-25 DIAGNOSIS — M6281 Muscle weakness (generalized): Secondary | ICD-10-CM | POA: Diagnosis not present

## 2023-06-26 DIAGNOSIS — R42 Dizziness and giddiness: Secondary | ICD-10-CM | POA: Diagnosis not present

## 2023-07-03 DIAGNOSIS — G629 Polyneuropathy, unspecified: Secondary | ICD-10-CM | POA: Diagnosis not present

## 2023-07-03 DIAGNOSIS — M6281 Muscle weakness (generalized): Secondary | ICD-10-CM | POA: Diagnosis not present

## 2023-07-05 DIAGNOSIS — M6281 Muscle weakness (generalized): Secondary | ICD-10-CM | POA: Diagnosis not present

## 2023-07-05 DIAGNOSIS — H353134 Nonexudative age-related macular degeneration, bilateral, advanced atrophic with subfoveal involvement: Secondary | ICD-10-CM | POA: Diagnosis not present

## 2023-07-05 DIAGNOSIS — G629 Polyneuropathy, unspecified: Secondary | ICD-10-CM | POA: Diagnosis not present

## 2023-07-05 DIAGNOSIS — Z961 Presence of intraocular lens: Secondary | ICD-10-CM | POA: Diagnosis not present

## 2023-07-12 DIAGNOSIS — G629 Polyneuropathy, unspecified: Secondary | ICD-10-CM | POA: Diagnosis not present

## 2023-07-12 DIAGNOSIS — M6281 Muscle weakness (generalized): Secondary | ICD-10-CM | POA: Diagnosis not present

## 2023-07-17 DIAGNOSIS — M6281 Muscle weakness (generalized): Secondary | ICD-10-CM | POA: Diagnosis not present

## 2023-07-17 DIAGNOSIS — G629 Polyneuropathy, unspecified: Secondary | ICD-10-CM | POA: Diagnosis not present

## 2023-07-24 DIAGNOSIS — G629 Polyneuropathy, unspecified: Secondary | ICD-10-CM | POA: Diagnosis not present

## 2023-07-24 DIAGNOSIS — M6281 Muscle weakness (generalized): Secondary | ICD-10-CM | POA: Diagnosis not present

## 2023-07-31 DIAGNOSIS — M6281 Muscle weakness (generalized): Secondary | ICD-10-CM | POA: Diagnosis not present

## 2023-07-31 DIAGNOSIS — G629 Polyneuropathy, unspecified: Secondary | ICD-10-CM | POA: Diagnosis not present

## 2023-07-31 DIAGNOSIS — K080 Exfoliation of teeth due to systemic causes: Secondary | ICD-10-CM | POA: Diagnosis not present

## 2023-08-02 DIAGNOSIS — M6281 Muscle weakness (generalized): Secondary | ICD-10-CM | POA: Diagnosis not present

## 2023-08-02 DIAGNOSIS — G629 Polyneuropathy, unspecified: Secondary | ICD-10-CM | POA: Diagnosis not present

## 2023-08-07 DIAGNOSIS — M6281 Muscle weakness (generalized): Secondary | ICD-10-CM | POA: Diagnosis not present

## 2023-08-07 DIAGNOSIS — G629 Polyneuropathy, unspecified: Secondary | ICD-10-CM | POA: Diagnosis not present

## 2023-08-09 DIAGNOSIS — M6281 Muscle weakness (generalized): Secondary | ICD-10-CM | POA: Diagnosis not present

## 2023-08-09 DIAGNOSIS — K080 Exfoliation of teeth due to systemic causes: Secondary | ICD-10-CM | POA: Diagnosis not present

## 2023-08-09 DIAGNOSIS — G629 Polyneuropathy, unspecified: Secondary | ICD-10-CM | POA: Diagnosis not present

## 2023-08-22 DIAGNOSIS — E785 Hyperlipidemia, unspecified: Secondary | ICD-10-CM | POA: Diagnosis not present

## 2023-08-22 DIAGNOSIS — E559 Vitamin D deficiency, unspecified: Secondary | ICD-10-CM | POA: Diagnosis not present

## 2023-08-22 DIAGNOSIS — E538 Deficiency of other specified B group vitamins: Secondary | ICD-10-CM | POA: Diagnosis not present

## 2023-08-22 DIAGNOSIS — R739 Hyperglycemia, unspecified: Secondary | ICD-10-CM | POA: Diagnosis not present

## 2023-08-22 DIAGNOSIS — Z0000 Encounter for general adult medical examination without abnormal findings: Secondary | ICD-10-CM | POA: Diagnosis not present

## 2023-08-22 DIAGNOSIS — M8580 Other specified disorders of bone density and structure, unspecified site: Secondary | ICD-10-CM | POA: Diagnosis not present

## 2023-08-22 DIAGNOSIS — I10 Essential (primary) hypertension: Secondary | ICD-10-CM | POA: Diagnosis not present

## 2023-08-23 DIAGNOSIS — M6281 Muscle weakness (generalized): Secondary | ICD-10-CM | POA: Diagnosis not present

## 2023-08-23 DIAGNOSIS — G629 Polyneuropathy, unspecified: Secondary | ICD-10-CM | POA: Diagnosis not present

## 2023-08-30 DIAGNOSIS — M6281 Muscle weakness (generalized): Secondary | ICD-10-CM | POA: Diagnosis not present

## 2023-08-30 DIAGNOSIS — G629 Polyneuropathy, unspecified: Secondary | ICD-10-CM | POA: Diagnosis not present

## 2023-09-06 ENCOUNTER — Ambulatory Visit: Payer: Medicare Other | Admitting: Obstetrics and Gynecology

## 2023-09-18 ENCOUNTER — Ambulatory Visit: Admitting: Obstetrics and Gynecology

## 2023-09-18 ENCOUNTER — Encounter: Payer: Self-pay | Admitting: Obstetrics and Gynecology

## 2023-09-18 VITALS — BP 139/75 | HR 63

## 2023-09-18 DIAGNOSIS — N3281 Overactive bladder: Secondary | ICD-10-CM

## 2023-09-18 DIAGNOSIS — N952 Postmenopausal atrophic vaginitis: Secondary | ICD-10-CM

## 2023-09-18 DIAGNOSIS — L900 Lichen sclerosus et atrophicus: Secondary | ICD-10-CM

## 2023-09-18 DIAGNOSIS — K5909 Other constipation: Secondary | ICD-10-CM

## 2023-09-18 MED ORDER — MAGNESIUM CITRATE PO SOLN: 1.0000 | Freq: Once | ORAL | 0 refills | Status: AC

## 2023-09-18 NOTE — Patient Instructions (Addendum)
   Use the clobetasol  cream at least twice a week in the areas circled above  Use the estrogen cream inside the vagina x2 weekly      You can start the pumpkin seed extract and then if that is not helpful we can re-start the Gemtesa  75mg  daily.

## 2023-09-18 NOTE — Progress Notes (Signed)
 [facility] Return Visit  SUBJECTIVE  History of Present Illness: Tiffany Mejia is a 88 y.o. female seen in follow-up for Lichens, vaginal atrophy, and OAB. She reports she was diligent in using her estrogen and clobetasol  cream for 3 months and then became less diligent. She states she has had significant constipation which has been her main concern.    Past Medical History: Patient  has a past medical history of Angiokeratoma, Cervical spondylosis without myelopathy, Depression, GERD (gastroesophageal reflux disease), History of basal cell carcinoma, HLD (hyperlipidemia), Hypertension, IBS (irritable bowel syndrome), Lichen sclerosus of female genitalia, Macular degeneration, Nocturnal leg cramps, Vertigo, and Vitamin B12 deficiency.   Past Surgical History: She  has a past surgical history that includes Cataract extraction (Right); Cervical spine surgery; gallbladder resection; Tonsillectomy; and Appendectomy.   Medications: She has a current medication list which includes the following prescription(s): vitamin d3, clobetasol  ointment, clobetasol  propionate e, estradiol , estrogens (conjugated), magnesium  citrate, melatonin, thiamine, vibegron , and cyanocobalamin .   Allergies: Patient is allergic to statins and nexium [esomeprazole magnesium ].   Social History: Patient  reports that she quit smoking about 28 years ago. Her smoking use included cigarettes. She has never used smokeless tobacco. She reports current alcohol use. She reports that she does not use drugs.     OBJECTIVE     Physical Exam: Vitals:   09/18/23 1528  BP: 139/75  Pulse: 63   Gen: No apparent distress, A&O x 3.  Detailed Urogynecologic Evaluation:  Internal vaginal exam reveals well moisturized atrophic tissues with no active bleeding, mass, or irritation. External genitalia has agglutination to the labial lips at 12 o'clock and 11 oc'clock, and 6 oclock. She also has small spider veins on  bilateral labial lips.    ASSESSMENT AND PLAN    Ms. Boschert is a 88 y.o. with:  1. Other constipation   2. Lichen sclerosus   3. Vaginal atrophy   4. Overactive bladder    Encouraged patient to consider options for her constipation including a bottle of magnesium  citrate, Power pudding, and miralax. Recipes for [employer] and power pudding given to patient to support her bowel movement needs.  Encouraged patient to go back to doing her Clobetasol  cream at least twice weekly and we discussed the agglutination.  Continue estrogen cream x2 weekly.  Patient reports she will try pumpkin seed extract and if she sees no improvement she will prefer to possibly go back on Gemtesa  75mg  daily.   Patient to return in 6 months or sooner if needed.    Noella Kipnis G Nanci Lakatos, NP

## 2023-10-16 DIAGNOSIS — H81399 Other peripheral vertigo, unspecified ear: Secondary | ICD-10-CM | POA: Diagnosis not present

## 2023-10-23 DIAGNOSIS — R278 Other lack of coordination: Secondary | ICD-10-CM | POA: Diagnosis not present

## 2023-10-23 DIAGNOSIS — H81399 Other peripheral vertigo, unspecified ear: Secondary | ICD-10-CM | POA: Diagnosis not present

## 2023-10-25 DIAGNOSIS — H81399 Other peripheral vertigo, unspecified ear: Secondary | ICD-10-CM | POA: Diagnosis not present

## 2023-10-25 DIAGNOSIS — R278 Other lack of coordination: Secondary | ICD-10-CM | POA: Diagnosis not present

## 2023-11-08 DIAGNOSIS — R030 Elevated blood-pressure reading, without diagnosis of hypertension: Secondary | ICD-10-CM | POA: Diagnosis not present

## 2023-11-08 DIAGNOSIS — R2681 Unsteadiness on feet: Secondary | ICD-10-CM | POA: Diagnosis not present

## 2023-11-08 DIAGNOSIS — R32 Unspecified urinary incontinence: Secondary | ICD-10-CM | POA: Diagnosis not present

## 2023-11-13 DIAGNOSIS — H81399 Other peripheral vertigo, unspecified ear: Secondary | ICD-10-CM | POA: Diagnosis not present

## 2023-11-13 DIAGNOSIS — R278 Other lack of coordination: Secondary | ICD-10-CM | POA: Diagnosis not present

## 2023-11-15 DIAGNOSIS — H81399 Other peripheral vertigo, unspecified ear: Secondary | ICD-10-CM | POA: Diagnosis not present

## 2023-11-15 DIAGNOSIS — R278 Other lack of coordination: Secondary | ICD-10-CM | POA: Diagnosis not present

## 2023-11-20 DIAGNOSIS — H81399 Other peripheral vertigo, unspecified ear: Secondary | ICD-10-CM | POA: Diagnosis not present

## 2023-11-20 DIAGNOSIS — R278 Other lack of coordination: Secondary | ICD-10-CM | POA: Diagnosis not present

## 2023-11-22 DIAGNOSIS — R278 Other lack of coordination: Secondary | ICD-10-CM | POA: Diagnosis not present

## 2023-11-22 DIAGNOSIS — H81399 Other peripheral vertigo, unspecified ear: Secondary | ICD-10-CM | POA: Diagnosis not present

## 2023-11-27 ENCOUNTER — Other Ambulatory Visit (HOSPITAL_COMMUNITY)
Admission: RE | Admit: 2023-11-27 | Discharge: 2023-11-27 | Disposition: A | Discharge status: Home / Self Care | Source: Other Acute Inpatient Hospital | Attending: Obstetrics and Gynecology | Admitting: Obstetrics and Gynecology

## 2023-11-27 ENCOUNTER — Ambulatory Visit (INDEPENDENT_AMBULATORY_CARE_PROVIDER_SITE_OTHER)

## 2023-11-27 VITALS — BP 133/67 | HR 77 | Temp 97.6°F

## 2023-11-27 DIAGNOSIS — R278 Other lack of coordination: Secondary | ICD-10-CM | POA: Diagnosis not present

## 2023-11-27 DIAGNOSIS — R319 Hematuria, unspecified: Secondary | ICD-10-CM

## 2023-11-27 DIAGNOSIS — R82998 Other abnormal findings in urine: Secondary | ICD-10-CM | POA: Diagnosis not present

## 2023-11-27 DIAGNOSIS — R300 Dysuria: Secondary | ICD-10-CM

## 2023-11-27 DIAGNOSIS — H81399 Other peripheral vertigo, unspecified ear: Secondary | ICD-10-CM | POA: Diagnosis not present

## 2023-11-27 LAB — POCT URINALYSIS DIP (CLINITEK)
Bilirubin, UA: NEGATIVE
Glucose, UA: NEGATIVE mg/dL
Ketones, POC UA: NEGATIVE mg/dL
Nitrite, UA: NEGATIVE
POC PROTEIN,UA: >=300 — AB
Spec Grav, UA: 1.025 (ref 1.010–1.025)
Urobilinogen, UA: 0.2 U/dL
pH, UA: 7 (ref 5.0–8.0)

## 2023-11-27 LAB — URINALYSIS, ROUTINE W REFLEX MICROSCOPIC
Bilirubin Urine: NEGATIVE
Glucose, UA: NEGATIVE mg/dL
Ketones, ur: NEGATIVE mg/dL
Nitrite: NEGATIVE
Protein, ur: 100 mg/dL — AB
RBC / HPF: >50 RBC/hpf (ref 0–5)
Specific Gravity, Urine: 1.021 (ref 1.005–1.030)
WBC, UA: >50 WBC/hpf (ref 0–5)
pH: 6 (ref 5.0–8.0)

## 2023-11-27 NOTE — Patient Instructions (Addendum)
 We will send your urine for culture as it was positive even while taking the Keflex.  You can take over the counter AZO two tablets up to three times a day for two days.  Take AZO tablets with a full glass of water. AZO will turn your urine orange, this is normal.   Please call if you continue to experience persistent or worsening urinary symptoms such as fever > 100.4, nausea/vomiting, one sided back pain or blood in your urine.    It was a pleasure to see you today!  Thank you for trusting me with your care!    Declyn Delsol,CMA

## 2023-11-27 NOTE — Progress Notes (Addendum)
 Tiffany Mejia arrived today with cloudy urine, dysuria, and lower abdominal pain. Patient is notexperiencing fever, unstable vitals and/or one-sided back flank pain. Patient has not had a recent hospitalization due to UTI.  Last visit in the office was 09/18/2023.  She has seen her PCP Dr. Chrystal and is on current treatment for a UTI, she was given Keflex TID for 5 day on 11-20-2023. She has her prescription in hand and still has some remanding in the bottle, and sounds like she hasn't been compliant with three times a day.   Per protocol:   The most recent Urinalysis completed on 11-08-2023 and was not normal. Please see the lab results in care everywhere. Final result shows Klebsiella oxytoca.   Last Creatinine level No results found for: CREATININE  An urine specimen was collected and POCT urinalysis completed. [] A cath specimen was collected due to patient's current condition, symptoms or post-procedural state.  Total urine output by catheter is  Output by Drain (mL) 11/25/23 0701 - 11/25/23 1900 11/25/23 1901 - 11/26/23 0700 11/26/23 0701 - 11/26/23 1900 11/26/23 1901 - 11/27/23 0700 11/27/23 0701 - 11/27/23 1441  Patient has no LDAs of requested type attached.    Tiffany Mejia    POCT Urine results is not normal.  Urine micro was sent per protocol for abnormal urinalysis.  Urine culture was sent per protocol for abnormal urinalysis.     [x] Pt was notified of positive urine results and plan for additional urine testing. We will contact you within the next 3-4 days with these results.  [] No Prescription was sent to your pharmacy.  The additional testing will indicate if a prescription is needed.   [] Patient was notified of abnormal urine results. The following prescription is sent to your preferred pharmacy.  []  Macrobid 100mg  #10 1 tablet by mouth twice daily with food for 5 days      []  Bactrim  DS 800-160mg  #6 1 tablet by mouth twice daily for 3 days        []  Due to your current medication  allergies, an alternate prescription was discussed with your provider and will be prescribed and sent to your pharmacy.  [] You can take over the counter AZO two tablets up to three times a day for two days.  Take AZO tablets with a full glass of water. AZO will turn your urine orange, this is normal.   [] The patient was notified of negative urine results.  If symptoms persist, you may take over the counter AZO two tablets up to three times a day for two days.  AZO will turn your urine orange, this is normal.  Contact the office back to schedule an appointment if your symptoms persist or worsen or you develop additional symptoms.       CC'd note to patient's provider.

## 2023-11-28 ENCOUNTER — Ambulatory Visit

## 2023-11-29 ENCOUNTER — Other Ambulatory Visit: Payer: Self-pay

## 2023-11-29 LAB — URINE CULTURE: Culture: 20000 — AB

## 2023-11-29 MED ORDER — CIPROFLOXACIN HCL 500 MG PO TABS: 500.0000 mg | ORAL_TABLET | Freq: Two times a day (BID) | ORAL | 0 refills | Status: AC

## 2023-11-29 NOTE — Progress Notes (Signed)
 Per discussion with K.Zuleta, NP. Cipro  500 mg BID. Take AZO  STOP the Keflex.  Please call if you continue to experience persistent or worsening urinary symptoms such as fever > 100.4, nausea/vomiting, one sided back pain or blood in your urine.

## 2023-12-04 DIAGNOSIS — R278 Other lack of coordination: Secondary | ICD-10-CM | POA: Diagnosis not present

## 2023-12-04 DIAGNOSIS — H81399 Other peripheral vertigo, unspecified ear: Secondary | ICD-10-CM | POA: Diagnosis not present

## 2023-12-06 DIAGNOSIS — R278 Other lack of coordination: Secondary | ICD-10-CM | POA: Diagnosis not present

## 2023-12-06 DIAGNOSIS — H81399 Other peripheral vertigo, unspecified ear: Secondary | ICD-10-CM | POA: Diagnosis not present

## 2023-12-13 DIAGNOSIS — R278 Other lack of coordination: Secondary | ICD-10-CM | POA: Diagnosis not present

## 2023-12-13 DIAGNOSIS — H81399 Other peripheral vertigo, unspecified ear: Secondary | ICD-10-CM | POA: Diagnosis not present

## 2023-12-24 DIAGNOSIS — H81399 Other peripheral vertigo, unspecified ear: Secondary | ICD-10-CM | POA: Diagnosis not present

## 2023-12-24 DIAGNOSIS — R278 Other lack of coordination: Secondary | ICD-10-CM | POA: Diagnosis not present

## 2023-12-27 DIAGNOSIS — H81399 Other peripheral vertigo, unspecified ear: Secondary | ICD-10-CM | POA: Diagnosis not present

## 2023-12-27 DIAGNOSIS — R278 Other lack of coordination: Secondary | ICD-10-CM | POA: Diagnosis not present

## 2024-01-01 DIAGNOSIS — H81399 Other peripheral vertigo, unspecified ear: Secondary | ICD-10-CM | POA: Diagnosis not present

## 2024-01-01 DIAGNOSIS — R278 Other lack of coordination: Secondary | ICD-10-CM | POA: Diagnosis not present

## 2024-01-10 DIAGNOSIS — R278 Other lack of coordination: Secondary | ICD-10-CM | POA: Diagnosis not present

## 2024-01-10 DIAGNOSIS — H81399 Other peripheral vertigo, unspecified ear: Secondary | ICD-10-CM | POA: Diagnosis not present

## 2024-01-15 DIAGNOSIS — H81399 Other peripheral vertigo, unspecified ear: Secondary | ICD-10-CM | POA: Diagnosis not present

## 2024-01-15 DIAGNOSIS — R278 Other lack of coordination: Secondary | ICD-10-CM | POA: Diagnosis not present

## 2024-01-17 DIAGNOSIS — Z961 Presence of intraocular lens: Secondary | ICD-10-CM | POA: Diagnosis not present

## 2024-01-17 DIAGNOSIS — H353114 Nonexudative age-related macular degeneration, right eye, advanced atrophic with subfoveal involvement: Secondary | ICD-10-CM | POA: Diagnosis not present

## 2024-01-17 DIAGNOSIS — H353124 Nonexudative age-related macular degeneration, left eye, advanced atrophic with subfoveal involvement: Secondary | ICD-10-CM | POA: Diagnosis not present

## 2024-01-21 DIAGNOSIS — H81399 Other peripheral vertigo, unspecified ear: Secondary | ICD-10-CM | POA: Diagnosis not present

## 2024-01-21 DIAGNOSIS — R278 Other lack of coordination: Secondary | ICD-10-CM | POA: Diagnosis not present

## 2024-01-23 DIAGNOSIS — R42 Dizziness and giddiness: Secondary | ICD-10-CM | POA: Diagnosis not present

## 2024-01-23 DIAGNOSIS — R8290 Unspecified abnormal findings in urine: Secondary | ICD-10-CM | POA: Diagnosis not present

## 2024-02-06 DIAGNOSIS — K080 Exfoliation of teeth due to systemic causes: Secondary | ICD-10-CM | POA: Diagnosis not present

## 2024-02-07 DIAGNOSIS — R1319 Other dysphagia: Secondary | ICD-10-CM | POA: Diagnosis not present

## 2024-02-07 DIAGNOSIS — K589 Irritable bowel syndrome without diarrhea: Secondary | ICD-10-CM | POA: Diagnosis not present

## 2024-02-07 DIAGNOSIS — R1032 Left lower quadrant pain: Secondary | ICD-10-CM | POA: Diagnosis not present

## 2024-02-14 ENCOUNTER — Ambulatory Visit

## 2024-02-16 DIAGNOSIS — Z20822 Contact with and (suspected) exposure to covid-19: Secondary | ICD-10-CM | POA: Diagnosis not present

## 2024-02-16 DIAGNOSIS — J019 Acute sinusitis, unspecified: Secondary | ICD-10-CM | POA: Diagnosis not present

## 2024-02-29 ENCOUNTER — Ambulatory Visit

## 2024-03-05 NOTE — Therapy (Signed)
 " OUTPATIENT PHYSICAL THERAPY VESTIBULAR EVALUATION     Patient Name: Tiffany Mejia MRN: [id number] DOB:1935/06/26, 88 y.o., female Today's Date: 03/05/2024  END OF SESSION:   Past Medical History:  Diagnosis Date   Angiokeratoma    Cervical spondylosis without myelopathy    Depression    GERD (gastroesophageal reflux disease)    History of basal cell carcinoma    HLD (hyperlipidemia)    Hypertension    IBS (irritable bowel syndrome)    Lichen sclerosus of female genitalia    Macular degeneration    Nocturnal leg cramps    Vertigo    Vitamin B12 deficiency    Past Surgical History:  Procedure Laterality Date   APPENDECTOMY     CATARACT EXTRACTION Right    CERVICAL SPINE SURGERY     gallbladder resection     TONSILLECTOMY     Patient Active Problem List   Diagnosis Date Noted   Disturbance of skin sensation 04/02/2013   Pain in limb 04/02/2013    PCP: Chrystal Lamarr RAMAN, MD  REFERRING PROVIDER: Llewellyn Sayres A, DO  REFERRING DIAG: R42 (ICD-10-CM) - Dizziness and giddiness  THERAPY DIAG:  No diagnosis found.  ONSET DATE: ***  Rationale for Evaluation and Treatment: Rehabilitation  SUBJECTIVE:   SUBJECTIVE STATEMENT: *** Pt accompanied by: {accompnied:27141}  PERTINENT HISTORY: Cervical spondylosis, depression, HLD, HTN, macular degeneration, vertigo, cervical spine surgery  PAIN:  Are you having pain? {OPRCPAIN:27236}  PRECAUTIONS: {Therapy precautions:24002}  RED FLAGS: {PT Red Flags:29287}   WEIGHT BEARING RESTRICTIONS: {Yes ***/No:24003}  FALLS: Has patient fallen in last 6 months? {fallsyesno:27318}  LIVING ENVIRONMENT: Lives with: {OPRC lives with:25569::lives with their family} Lives in: {Lives in:25570} Stairs: {opstairs:27293} Has following equipment at home: {Assistive devices:23999}  PLOF: {PLOF:24004}  PATIENT GOALS: ***  OBJECTIVE:  Note: Objective measures were completed at Evaluation unless otherwise  noted.  DIAGNOSTIC FINDINGS: none recent   COGNITION: Overall cognitive status: {cognition:24006}   SENSATION: {sensation:27233}  POSTURE:  {posture:25561}  GAIT: Gait pattern: {gait characteristics:25376} Distance walked: *** Assistive device utilized: {Assistive devices:23999} Level of assistance: {Levels of assistance:24026} Comments: ***  FUNCTIONAL TESTS:  {Functional tests:24029}  PATIENT SURVEYS:  DHI: ***  VESTIBULAR ASSESSMENT   GENERAL OBSERVATION: ***   OCULOMOTOR EXAM: {Ocular Alignment:32714:s}   Ocular ROM: {RANGE OF MOTION:21649}   Spontaneous Nystagmus: {Spontaneous nystagmus:25263}   Gaze-Induced Nystagmus: {gaze-induced nystagmus:25264}   Smooth Pursuits: {smooth pursuit:25265}   Saccades: {saccades:25266}   Convergence/Divergence: *** cm    VESTIBULAR - OCULAR REFLEX:    Slow VOR: {slow VOR:25290}   VOR Cancellation: {vor cancellation:25291}   Head-Impulse Test: {head impulse test:25272}   Dynamic Visual Acuity: {dynamic visual acuity:25273}  AUDITORY SCREEN:  Rinne Test {DESC;Negative/Positive:115700014}    POSITIONAL TESTING:  Right Roll Test: *** Left Roll Test: ***  Right Loaded Dix-Hallpike: *** Left Loaded Dix-Hallpike: ***  Right Sidelying: *** Left Sidelying: ***     MOTION SENSITIVITY:    Motion Sensitivity Quotient  Intensity: 0 = none, 1 = Lightheaded, 2 = Mild, 3 = Moderate, 4 = Severe, 5 = Vomiting  Intensity  1. Sitting to supine   2. Supine to L side   3. Supine to R side   4. Supine to sitting   5. L Hallpike-Dix   6. Up from L    7. R Hallpike-Dix   8. Up from R    9. Sitting, head  tipped to L knee   10. Head up from L  knee   11. Sitting,  head  tipped to R knee   12. Head up from R  knee   13. Sitting head turns x5   14.Sitting head nods x5   15. In stance, 180  turn to L    16. In stance, 180  turn to R                                                                                                                                  TREATMENT DATE: ***   Canalith Repositioning:  {Canalith Repositioning:25283} Gaze Adaptation:  {gaze adaptation:25286} Habituation:  {habituation:25288} Other: ***  PATIENT EDUCATION: Education details: *** Person educated: {Person educated:25204} Education method: {Education Method:25205} Education comprehension: {Education Comprehension:25206}  HOME EXERCISE PROGRAM:   GOALS: Goals reviewed with patient? {yes/no:20286}  SHORT TERM GOALS: Target date: {follow up:25551}  Patient to be independent with initial HEP. Baseline: HEP initiated Goal status: {GOALSTATUS:25110}    LONG TERM GOALS: Target date: {follow up:25551}  Patient to be independent with advanced HEP. Baseline: Not yet initiated  Goal status: {GOALSTATUS:25110}  Patient to report 0/10 dizziness with standing vertical and horizontal VOR for 30 seconds. Baseline: Unable Goal status: {GOALSTATUS:25110}  Patient will report 0/10 dizziness with bed mobility.  Baseline: Symptomatic  Goal status: {GOALSTATUS:25110}  Patient to demonstrate *** sway with M-CTSIB condition with eyes closed/foam surface in order to improve safety in environments with uneven surfaces and dim lighting. Baseline: *** Goal status: {GOALSTATUS:25110}  Patient to score at least 20/24 on DGI in order to decrease risk of falls. Baseline: *** Goal status: {GOALSTATUS:25110}  Patient will ambulate over outdoor surfaces with LRAD while performing head turns to scan environment with good stability in order to indicate safe community mobility. Baseline: Unable Goal status: {GOALSTATUS:25110}  Patient to score at least 18 points less on DHI in order to meet MCID and improve functional outcomes.  Baseline: *** Goal status: {GOALSTATUS:25110}   ASSESSMENT:  CLINICAL IMPRESSION:  Patient is a 88 y/o F presenting to OPPT with c/o *** for the past ***  Patient today presenting with ***.     Patient was educated on gentle *** HEP and reported understanding. Prior to current episode, patient was independent. Would benefit from skilled PT services *** x/week for *** weeks to address aforementioned impairments in order to optimize level of function.    OBJECTIVE IMPAIRMENTS: {opptimpairments:25111}.   ACTIVITY LIMITATIONS: {activitylimitations:27494}  PARTICIPATION LIMITATIONS: {participationrestrictions:25113}  PERSONAL FACTORS: {Personal factors:25162} are also affecting patient's functional outcome.   REHAB POTENTIAL: {rehabpotential:25112}  CLINICAL DECISION MAKING: {clinical decision making:25114}  EVALUATION COMPLEXITY: {Evaluation complexity:25115}   PLAN:  PT FREQUENCY: {rehab frequency:25116}  PT DURATION: {rehab duration:25117}  PLANNED INTERVENTIONS: {rehab planned interventions:25118::97110-Therapeutic exercises,97530- Therapeutic [phone]- Neuromuscular re-education,97535- Self Rjmz,02859- Manual therapy,Patient/Family education}  PLAN FOR NEXT SESSION: ***   Slater MARLA Christians, PT 03/05/2024, 1:41 PM  "

## 2024-03-07 ENCOUNTER — Other Ambulatory Visit: Payer: Self-pay

## 2024-03-07 ENCOUNTER — Ambulatory Visit: Attending: Otolaryngology | Admitting: Physical Therapy

## 2024-03-07 ENCOUNTER — Encounter: Payer: Self-pay | Admitting: Physical Therapy

## 2024-03-07 DIAGNOSIS — H8112 Benign paroxysmal vertigo, left ear: Secondary | ICD-10-CM | POA: Insufficient documentation

## 2024-03-07 DIAGNOSIS — R42 Dizziness and giddiness: Secondary | ICD-10-CM | POA: Insufficient documentation

## 2024-03-07 DIAGNOSIS — R2681 Unsteadiness on feet: Secondary | ICD-10-CM | POA: Insufficient documentation

## 2024-03-14 ENCOUNTER — Ambulatory Visit: Admitting: Physical Therapy

## 2024-03-14 ENCOUNTER — Encounter: Payer: Self-pay | Admitting: Physical Therapy

## 2024-03-14 DIAGNOSIS — H8112 Benign paroxysmal vertigo, left ear: Secondary | ICD-10-CM

## 2024-03-14 DIAGNOSIS — R2681 Unsteadiness on feet: Secondary | ICD-10-CM

## 2024-03-14 DIAGNOSIS — R42 Dizziness and giddiness: Secondary | ICD-10-CM

## 2024-03-14 NOTE — Therapy (Signed)
 " OUTPATIENT PHYSICAL THERAPY VESTIBULAR TREATMENT NOTE     Patient Name: Tiffany Mejia MRN: [id number] DOB:10-26-1935, 89 y.o., female Today's Date: 03/14/2024  END OF SESSION:  PT End of Session - 03/14/24 0836     Visit Number 2    Number of Visits 13    Date for Recertification  04/18/24    Authorization Type BCBS Medicare    PT Start Time [phone]    PT Stop Time 0927    PT Time Calculation (min) 41 min    Activity Tolerance Patient tolerated treatment well    Behavior During Therapy WFL for tasks assessed/performed           Past Medical History:  Diagnosis Date   Angiokeratoma    Cervical spondylosis without myelopathy    Depression    GERD (gastroesophageal reflux disease)    History of basal cell carcinoma    HLD (hyperlipidemia)    Hypertension    IBS (irritable bowel syndrome)    Lichen sclerosus of female genitalia    Macular degeneration    Nocturnal leg cramps    Vertigo    Vitamin B12 deficiency    Past Surgical History:  Procedure Laterality Date   APPENDECTOMY     CATARACT EXTRACTION Right    CERVICAL SPINE SURGERY     gallbladder resection     TONSILLECTOMY     Patient Active Problem List   Diagnosis Date Noted   Disturbance of skin sensation 04/02/2013   Pain in limb 04/02/2013    PCP: Chrystal Lamarr RAMAN, MD  REFERRING PROVIDER: Llewellyn Gerard LABOR, DO  REFERRING DIAG: R42 (ICD-10-CM) - Dizziness and giddiness  THERAPY DIAG:  BPPV (benign paroxysmal positional vertigo), left  Dizziness and giddiness  Unsteadiness on feet  ONSET DATE: several months  Rationale for Evaluation and Treatment: Rehabilitation  SUBJECTIVE:   SUBJECTIVE STATEMENT: Just taken a long time to get here.  When I roll over to the left, is when the dizziness comes on.  It's a heaviness when I roll.  If I stand up and walk in the middle of the night, it's a sensation of pushing/pulling to the left side.  Patient reports dizziness on and off for years, but  recently an episode going on for several months. Reports that this current episode started with sinusitis- 1st episode 6-9 months ago but came back 8 weeks ago. Feel like everything is on the L. Reports that she has had the Epley maneuver at the facility that she lives but has stopped being seen by them. Reports that there was no nystagmus but it improved her symptoms a little bit. Was seen by ENT- they did not do an assessment with the goggles.' Reports some increased issues with balance- notes terrible eye sight, neuropathy in my feet, and imbalance. Has a feeling of being pulled to the L. Denies head trauma, vision changes/double vision, migraines. Reports some recent issues with hearing which she reports is quite good at baseline.Reports chronic tinnitus.    Pt accompanied by: self  PERTINENT HISTORY: Cervical spondylosis, depression, HLD, HTN, macular degeneration, vertigo, cervical spine surgery; pt reports significant visual impairment from macular degeneration   PAIN:  Are you having pain? No  PRECAUTIONS: Fall  RED FLAGS: None   WEIGHT BEARING RESTRICTIONS: No  FALLS: Has patient fallen in last 6 months? No  LIVING ENVIRONMENT: Lives with: alone in Independent Living Lives in: House/apartment Stairs: none Has following equipment at home: Single point cane, Environmental Consultant - 2 wheeled,  and Walker - 4 wheeled  PLOF: Independent, Vocation/Vocational requirements: retired, and Leisure: use the computer  PATIENT GOALS: improve dizziness  OBJECTIVE:   TODAY'S TREATMENT: 03/14/2024 Activity Comments  DGI 11/24  L loaded DH No overt nystagmus, latent very brief feeling of heavy/dizzy  R loaded DH Negative  Treated L Epley Tolerates well, no dizziness upon sitting  L roll test Feels the dizziness, rotational upbeating nystagmus x 5 sec  L loaded DH L upbeating nystagmus x 5-10 sec  L Epley Tolerates well, dizziness noted in position 2 and position 3; no dizziness upon sitting  L  loaded DH No symptoms, no nystagmus, proceeded through L Epley    [facility] PT Assessment - 03/14/24 0852       Standardized Balance Assessment   Standardized Balance Assessment Dynamic Gait Index      Dynamic Gait Index   Level Surface Moderate Impairment   9.93 sec   Change in Gait Speed Mild Impairment    Gait with Horizontal Head Turns Moderate Impairment   17 sec   Gait with Vertical Head Turns Moderate Impairment   16.5 sec   Gait and Pivot Turn Mild Impairment    Step Over Obstacle Severe Impairment    Step Around Obstacles Mild Impairment    Steps Mild Impairment    Total Score 11    DGI comment: Scores <19/24 indicate increased fall risk          PATIENT EDUCATION: Education details: Rationale for BPPV treatment, post CRM expectations Person educated: Patient Education method: Explanation, Demonstration, and Verbal cues Education comprehension: verbalized understanding, returned demonstration, and needs further education  ------------------------------------------ Note: Objective measures were completed at Evaluation unless otherwise noted.  DIAGNOSTIC FINDINGS: none recent   COGNITION: Overall cognitive status: Within functional limits for tasks assessed   SENSATION: Pt reports hx of LE neuropathy   POSTURE:  rounded shoulders and forward head  GAIT: Gait pattern: unsteadiness, slowed gait Assistive device utilized: Single point cane Level of assistance: Modified independence and SBA  PATIENT SURVEYS:  DHI: 32/100  VESTIBULAR ASSESSMENT  *increased difficulty to complete oculomotor exam d/t visual impairment    GENERAL OBSERVATION: pt wears bifocals but does not use them while walking    OCULOMOTOR EXAM: Ocular alignment: WNL   Ocular ROM: No Limitations   Spontaneous Nystagmus: absent   Gaze-Induced Nystagmus: absent   Smooth Pursuits: 1-2 saccades to L   Saccades: slow   Convergence/Divergence: 5 cm    VESTIBULAR - OCULAR REFLEX:    Slow VOR:  Normal; c/o some discomfort horizontal and vertical    VOR Cancellation: Corrective Saccades subtle to L   Head-Impulse Test: HIT Right: covert positive HIT Left: covert positive *HIT results were not consistent and could be d/t pt's limited visual acuity    AUDITORY SCREEN:  Rinne Test WNL B    POSITIONAL TESTING:  Sit>supine: latent c/o dizziness and subtle few sec on L upbeating torsional nystagmus  Right Roll Test: negative  Left Roll Test: L upbeating torsional nystagmus lasting ~8 sec  TREATMENT DATE: 03/07/24  PATIENT EDUCATION: Education details: prognosis, POC, edu on exam findings and tx plan , answered pt's questions on upcoming audiology appt Person educated: Patient Education method: Explanation Education comprehension: verbalized understanding  HOME EXERCISE PROGRAM:   GOALS: Goals reviewed with patient? Yes  SHORT TERM GOALS: Target date: 03/28/2024  Patient to be independent with initial HEP. Baseline: HEP initiated Goal status: INITIAL    LONG TERM GOALS: Target date: 04/18/2024  Patient to be independent with advanced HEP. Baseline: Not yet initiated  Goal status: INITIAL  Patient to report 0/10 dizziness with standing vertical and horizontal VOR for 30 seconds. Baseline: Unable Goal status: INITIAL  Patient will report 0/10 dizziness with bed mobility.  Baseline: Symptomatic  Goal status: INITIAL  Patient to score at least 20/24 on DGI in order to decrease risk of falls. Baseline: NT Goal status: INITIAL  Patient to report return to household chores without dizziness limiting.  Baseline: symptomatic  Goal status: INITIAL  Patient to score at least 18 points less on DHI in order to meet MCID and improve functional outcomes.  Baseline: 32 Goal status: INITIAL   ASSESSMENT:  CLINICAL IMPRESSION: Pt presents  today with continued complaints of sensation of dizziness/pulling to the left upon standing and heaviness/dizziness with rolling to L.  Assessed for BPPV today, with pt positive initially for L [facility] with sensation of dizziness (no nystagmus noted).  Pt demo rolling to L and brings on dizziness, with L upbeating rotational nystagmus.  Tried again L DH (loaded), with nystagmus and dizziness x 5-10 seconds this time.  Treated with Epley x 2 additional times, no dizziness and no nystagmus last trial of L DH.  She tolerates treatment well and no complaints of dizziness or increased unsteadiness upon leaving session today.  Pt will continue to benefit from skilled PT towards goals for improved functional mobility, decreased dizziness, and decreased fall risk.   EVAL:  Patient is a 89 y/o F presenting to OPPT with c/o dizziness for the past several months. Of note, she reports sinusitis with recurrence 8 weeks ago. Notes that she had prior PT evaluation and treatment but no nystagmus was evident with positional testing but she still received Epley maneuver. Denies head trauma, vision changes/double vision, migraines. Reports some recent issues with hearing, chronic tinnitus, imbalance. Patient today presenting with Slowed saccades, a few saccades with smooth pursuit, symptomatic VOR, slight corrective saccades with L VOR cancellation, covert positive B HIT, and nystagmus with L roll test suggesting possible L canalithiasis. Plan to test further on next appointment. Abnormal oculomotor exam could be due to patients limited vision. Would benefit from skilled PT services 1-2 x/week for 6 weeks to address aforementioned impairments in order to optimize level of function.    OBJECTIVE IMPAIRMENTS: Abnormal gait, decreased activity tolerance, decreased balance, dizziness, and postural dysfunction.   ACTIVITY LIMITATIONS: carrying, lifting, bending, sitting, standing, squatting, sleeping, stairs, transfers, bed mobility,  toileting, dressing, reach over head, hygiene/grooming, and locomotion level  PARTICIPATION LIMITATIONS: meal prep, cleaning, laundry, shopping, community activity, and church  PERSONAL FACTORS: Age, Past/current experiences, Time since onset of injury/illness/exacerbation, and 3+ comorbidities: Cervical spondylosis, depression, HLD, HTN, macular degeneration, vertigo, cervical spine surgery are also affecting patient's functional outcome.   REHAB POTENTIAL: Good  CLINICAL DECISION MAKING: Evolving/moderate complexity  EVALUATION COMPLEXITY: Moderate   PLAN:  PT FREQUENCY: 1-2x/week  PT DURATION: 6 weeks  PLANNED INTERVENTIONS: 97164- PT Re-evaluation, 97110-Therapeutic exercises, 97530- Therapeutic activity, [id number]- Neuromuscular re-education, 97535- Self Care, 02859-  Manual therapy, [id number]- Gait training, 04007- Canalith repositioning, 79439 (1-2 muscles), 20561 (3+ muscles)- Dry Needling, Patient/Family education, Balance training, Stair training, Taping, Joint mobilization, Spinal mobilization, Vestibular training, Cryotherapy, and Moist heat  PLAN FOR NEXT SESSION: Test again and treat L/R DH.  Balance, vestibular/multi-sensory balance exercises and/or habituation.       Greig Anon, PT 03/14/2024 9:32 AM Phone: [phone] Fax: [phone]  [facility] at [facility] [address] [place], [address] [place], [place] [address] Phone # [phone] Fax # [phone]   "

## 2024-03-17 ENCOUNTER — Encounter: Payer: Self-pay | Admitting: *Deleted

## 2024-03-21 ENCOUNTER — Ambulatory Visit: Admitting: Physical Therapy

## 2024-03-24 ENCOUNTER — Ambulatory Visit: Admitting: Physical Therapy

## 2024-03-24 ENCOUNTER — Encounter: Payer: Self-pay | Admitting: Physical Therapy

## 2024-03-24 DIAGNOSIS — R42 Dizziness and giddiness: Secondary | ICD-10-CM

## 2024-03-24 DIAGNOSIS — H8112 Benign paroxysmal vertigo, left ear: Secondary | ICD-10-CM

## 2024-03-24 NOTE — Therapy (Signed)
 " OUTPATIENT PHYSICAL THERAPY VESTIBULAR TREATMENT NOTE     Patient Name: Tiffany Mejia MRN: [id number] DOB:Feb 12, 1936, 89 y.o., female Today's Date: 03/24/2024  END OF SESSION:  PT End of Session - 03/24/24 1059     Visit Number 3    Number of Visits 13    Date for Recertification  04/18/24    Authorization Type BCBS Medicare    Progress Note Due on Visit 10    PT Start Time 1102    PT Stop Time 1144    PT Time Calculation (min) 42 min    Activity Tolerance Patient tolerated treatment well    Behavior During Therapy WFL for tasks assessed/performed            Past Medical History:  Diagnosis Date   Angiokeratoma    Cervical spondylosis without myelopathy    Depression    GERD (gastroesophageal reflux disease)    History of basal cell carcinoma    HLD (hyperlipidemia)    Hypertension    IBS (irritable bowel syndrome)    Lichen sclerosus of female genitalia    Macular degeneration    Nocturnal leg cramps    Vertigo    Vitamin B12 deficiency    Past Surgical History:  Procedure Laterality Date   APPENDECTOMY     CATARACT EXTRACTION Right    CERVICAL SPINE SURGERY     gallbladder resection     TONSILLECTOMY     Patient Active Problem List   Diagnosis Date Noted   Disturbance of skin sensation 04/02/2013   Pain in limb 04/02/2013    PCP: Chrystal Lamarr RAMAN, MD  REFERRING PROVIDER: Llewellyn Gerard LABOR, DO  REFERRING DIAG: R42 (ICD-10-CM) - Dizziness and giddiness  THERAPY DIAG:  BPPV (benign paroxysmal positional vertigo), left  Dizziness and giddiness  ONSET DATE: several months  Rationale for Evaluation and Treatment: Rehabilitation  SUBJECTIVE:   SUBJECTIVE STATEMENT: Up until 48 hours ago, I was good-thought I was cured.  I did have Covid; after I did the treatment last visit, I felt so much better.  Did roll over on my left side this morning, and I could feel it, not as intense.   Pt accompanied by: self  PERTINENT HISTORY: Cervical  spondylosis, depression, HLD, HTN, macular degeneration, vertigo, cervical spine surgery; pt reports significant visual impairment from macular degeneration   PAIN:  Are you having pain? No  PRECAUTIONS: Fall  RED FLAGS: None   WEIGHT BEARING RESTRICTIONS: No  FALLS: Has patient fallen in last 6 months? No  LIVING ENVIRONMENT: Lives with: alone in Independent Living Lives in: House/apartment Stairs: none Has following equipment at home: Single point cane, Environmental Consultant - 2 wheeled, and Environmental Consultant - 4 wheeled  PLOF: Independent, Vocation/Vocational requirements: retired, and Leisure: use the computer  PATIENT GOALS: improve dizziness  OBJECTIVE:    TODAY'S TREATMENT: 03/24/2024 Activity Comments  L loaded DH negative  L Roll negative  R roll negative  R Loaded DH Negative; feels unsteady, but no nystagmus  L Brandt-Daroff Negative, no symptoms  L loaded DH negative  Standing multi-sensory balance exercises: Feet apart/feet together EO/EC head turns/head nods  Light UE support-with EC feet together pt feels very guarded and tense  Seated VOR horizontal Slowed pace, mild discomfort   M-CTSIB  Condition 1: Firm Surface, EO 30 Sec, Mild Sway  Condition 2: Firm Surface, EC 9 Sec, Severe Sway  Condition 3: Foam Surface, EO NT Sec, NT Sway  Condition 4: Foam Surface, EC NT Sec, NT  Sway    Access Code: [email or web address] URL: [email or web address] Date: 03/24/2024 Prepared by: [facility] - Outpatient  Rehab - [facility]  Exercises - Wide Stance with Head Nods and Head Turns  - 1-2 x daily - 7 x weekly - 1 sets - 5 reps    TREATMENT: 03/14/2024 Activity Comments  DGI 11/24  L loaded DH No overt nystagmus, latent very brief feeling of heavy/dizzy  R loaded DH Negative  Treated L Epley Tolerates well, no dizziness upon sitting  L roll test Feels the dizziness, rotational upbeating nystagmus x 5 sec  L loaded DH L upbeating nystagmus x 5-10 sec  L Epley Tolerates well,  dizziness noted in position 2 and position 3; no dizziness upon sitting  L loaded DH No symptoms, no nystagmus, proceeded through L Epley      PATIENT EDUCATION: Education details: update to HEP; rationale for exercises for multi-sensory balance to address vestibular system Person educated: Patient Education method: Explanation, Demonstration, and Verbal cues Education comprehension: verbalized understanding, returned demonstration, and needs further education  ------------------------------------------ Note: Objective measures were completed at Evaluation unless otherwise noted.  DIAGNOSTIC FINDINGS: none recent   COGNITION: Overall cognitive status: Within functional limits for tasks assessed   SENSATION: Pt reports hx of LE neuropathy   POSTURE:  rounded shoulders and forward head  GAIT: Gait pattern: unsteadiness, slowed gait Assistive device utilized: Single point cane Level of assistance: Modified independence and SBA  PATIENT SURVEYS:  DHI: 32/100  VESTIBULAR ASSESSMENT  *increased difficulty to complete oculomotor exam d/t visual impairment    GENERAL OBSERVATION: pt wears bifocals but does not use them while walking    OCULOMOTOR EXAM: Ocular alignment: WNL   Ocular ROM: No Limitations   Spontaneous Nystagmus: absent   Gaze-Induced Nystagmus: absent   Smooth Pursuits: 1-2 saccades to L   Saccades: slow   Convergence/Divergence: 5 cm    VESTIBULAR - OCULAR REFLEX:    Slow VOR: Normal; c/o some discomfort horizontal and vertical    VOR Cancellation: Corrective Saccades subtle to L   Head-Impulse Test: HIT Right: covert positive HIT Left: covert positive *HIT results were not consistent and could be d/t pt's limited visual acuity    AUDITORY SCREEN:  Rinne Test WNL B    POSITIONAL TESTING:  Sit>supine: latent c/o dizziness and subtle few sec on L upbeating torsional nystagmus  Right Roll Test: negative  Left Roll Test: L upbeating torsional  nystagmus lasting ~8 sec                                                                                                                                 TREATMENT DATE: 03/07/24  PATIENT EDUCATION: Education details: prognosis, POC, edu on exam findings and tx plan , answered pt's questions on upcoming audiology appt Person educated: Patient Education method: Explanation Education comprehension: verbalized understanding  HOME EXERCISE PROGRAM:   GOALS: Goals reviewed with patient? Yes  SHORT  TERM GOALS: Target date: 03/28/2024  Patient to be independent with initial HEP. Baseline: HEP initiated Goal status: INITIAL    LONG TERM GOALS: Target date: 04/18/2024  Patient to be independent with advanced HEP. Baseline: Not yet initiated  Goal status: INITIAL  Patient to report 0/10 dizziness with standing vertical and horizontal VOR for 30 seconds. Baseline: Unable Goal status: INITIAL  Patient will report 0/10 dizziness with bed mobility.  Baseline: Symptomatic  Goal status: INITIAL  Patient to score at least 20/24 on DGI in order to decrease risk of falls. Baseline: NT Goal status: INITIAL  Patient to report return to household chores without dizziness limiting.  Baseline: symptomatic  Goal status: INITIAL  Patient to score at least 18 points less on DHI in order to meet MCID and improve functional outcomes.  Baseline: 32 Goal status: INITIAL   ASSESSMENT:  CLINICAL IMPRESSION: Pt presents today with reports of feeling better overall, but she did note that the dizziness almost started again this morning upon rolling to L.  With all positional testing today, no symptoms, no nystagmus noted.  Looked at L sidelying test/Brandt-Daroff position, with no dizziness noted.  With attempts at MCTSIB testing, pt is very unsteady in condition 2, not able to hold > 9 seconds with multiple tries, so did not progress to conditions 3 and 4.   Worked on multi-sensory balance to  address vestibular system use for balance; able to initiate HEP today with EO/EC head motions with instructions provided for safe performance of HEP.  Pt will continue to benefit from skilled PT towards goals for improved functional mobility, decreased dizziness, and decreased fall risk.    EVAL:  Patient is a 89 y/o F presenting to OPPT with c/o dizziness for the past several months. Of note, she reports sinusitis with recurrence 8 weeks ago. Notes that she had prior PT evaluation and treatment but no nystagmus was evident with positional testing but she still received Epley maneuver. Denies head trauma, vision changes/double vision, migraines. Reports some recent issues with hearing, chronic tinnitus, imbalance. Patient today presenting with Slowed saccades, a few saccades with smooth pursuit, symptomatic VOR, slight corrective saccades with L VOR cancellation, covert positive B HIT, and nystagmus with L roll test suggesting possible L canalithiasis. Plan to test further on next appointment. Abnormal oculomotor exam could be due to patients limited vision. Would benefit from skilled PT services 1-2 x/week for 6 weeks to address aforementioned impairments in order to optimize level of function.    OBJECTIVE IMPAIRMENTS: Abnormal gait, decreased activity tolerance, decreased balance, dizziness, and postural dysfunction.   ACTIVITY LIMITATIONS: carrying, lifting, bending, sitting, standing, squatting, sleeping, stairs, transfers, bed mobility, toileting, dressing, reach over head, hygiene/grooming, and locomotion level  PARTICIPATION LIMITATIONS: meal prep, cleaning, laundry, shopping, community activity, and church  PERSONAL FACTORS: Age, Past/current experiences, Time since onset of injury/illness/exacerbation, and 3+ comorbidities: Cervical spondylosis, depression, HLD, HTN, macular degeneration, vertigo, cervical spine surgery are also affecting patient's functional outcome.   REHAB POTENTIAL:  Good  CLINICAL DECISION MAKING: Evolving/moderate complexity  EVALUATION COMPLEXITY: Moderate   PLAN:  PT FREQUENCY: 1-2x/week  PT DURATION: 6 weeks  PLANNED INTERVENTIONS: 97164- PT Re-evaluation, 97110-Therapeutic exercises, 97530- Therapeutic activity, [id number]- Neuromuscular re-education, 97535- Self Care, 02859- Manual therapy, [id number]- Gait training, [phone]- Canalith repositioning, [id number] (1-2 muscles), 20561 (3+ muscles)- Dry Needling, Patient/Family education, Balance training, Stair training, Taping, Joint mobilization, Spinal mobilization, Vestibular training, Cryotherapy, and Moist heat  PLAN FOR NEXT SESSION: Retest positional vertigo as needed;  progress balance, vestibular/multi-sensory balance exercises and/or habituation.     Greig Anon, PT 03/24/24 11:52 AM Phone: [phone] Fax: [phone]  [facility] at [facility] [address] [place], [address] [place], [place] [address] Phone # [phone] Fax # [phone]   "

## 2024-03-26 ENCOUNTER — Ambulatory Visit

## 2024-03-26 DIAGNOSIS — R42 Dizziness and giddiness: Secondary | ICD-10-CM

## 2024-03-26 DIAGNOSIS — H8112 Benign paroxysmal vertigo, left ear: Secondary | ICD-10-CM

## 2024-03-26 DIAGNOSIS — R2681 Unsteadiness on feet: Secondary | ICD-10-CM

## 2024-03-26 NOTE — Therapy (Signed)
 " OUTPATIENT PHYSICAL THERAPY VESTIBULAR TREATMENT NOTE     Patient Name: Tiffany Mejia MRN: [id number] DOB:[birth year], 89 y.o., female Today's Date: 03/26/2024  END OF SESSION:  PT End of Session - 03/26/24 1104     Visit Number 4    Number of Visits 13    Date for Recertification  04/18/24    Authorization Type BCBS Medicare    Progress Note Due on Visit 10    PT Start Time 1104    PT Stop Time 1145    PT Time Calculation (min) 41 min    Activity Tolerance Patient tolerated treatment well    Behavior During Therapy WFL for tasks assessed/performed            Past Medical History:  Diagnosis Date   Angiokeratoma    Cervical spondylosis without myelopathy    Depression    GERD (gastroesophageal reflux disease)    History of basal cell carcinoma    HLD (hyperlipidemia)    Hypertension    IBS (irritable bowel syndrome)    Lichen sclerosus of female genitalia    Macular degeneration    Nocturnal leg cramps    Vertigo    Vitamin B12 deficiency    Past Surgical History:  Procedure Laterality Date   APPENDECTOMY     CATARACT EXTRACTION Right    CERVICAL SPINE SURGERY     gallbladder resection     TONSILLECTOMY     Patient Active Problem List   Diagnosis Date Noted   Disturbance of skin sensation 04/02/2013   Pain in limb 04/02/2013    PCP: Chrystal Lamarr RAMAN, MD  REFERRING PROVIDER: Llewellyn Gerard LABOR, DO  REFERRING DIAG: R42 (ICD-10-CM) - Dizziness and giddiness  THERAPY DIAG:  BPPV (benign paroxysmal positional vertigo), left  Dizziness and giddiness  Unsteadiness on feet  ONSET DATE: several months  Rationale for Evaluation and Treatment: Rehabilitation  SUBJECTIVE:   SUBJECTIVE STATEMENT: Had been doing well and then at 6 AM today had a slight/brief episode with turning head left in bed   Pt accompanied by: self  PERTINENT HISTORY: Cervical spondylosis, depression, HLD, HTN, macular degeneration, vertigo, cervical spine surgery; pt  reports significant visual impairment from macular degeneration   PAIN:  Are you having pain? No  PRECAUTIONS: Fall  RED FLAGS: None   WEIGHT BEARING RESTRICTIONS: No  FALLS: Has patient fallen in last 6 months? No  LIVING ENVIRONMENT: Lives with: alone in Independent Living Lives in: House/apartment Stairs: none Has following equipment at home: Single point cane, Environmental Consultant - 2 wheeled, and Environmental Consultant - 4 wheeled  PLOF: Independent, Vocation/Vocational requirements: retired, and Leisure: use the computer  PATIENT GOALS: improve dizziness  OBJECTIVE:   TODAY'S TREATMENT: 03/26/24 Activity Comments  Loaded Dix-Hallpike  Left: no vertigo/nystagmus Right: no vertigo/nystagmus but feels off  Roll test No vertigo/nystagmus  Brandt-Daroff Discussion of self-assessment for BPPV and performance of subsequent repetitions               TODAY'S TREATMENT: 03/24/2024 Activity Comments  L loaded DH negative  L Roll negative  R roll negative  R Loaded DH Negative; feels unsteady, but no nystagmus  L Brandt-Daroff Negative, no symptoms  L loaded DH negative  Standing multi-sensory balance exercises: Feet apart/feet together EO/EC head turns/head nods  Light UE support-with EC feet together pt feels very guarded and tense  Seated VOR horizontal Slowed pace, mild discomfort   M-CTSIB  Condition 1: Firm Surface, EO 30 Sec, Mild Sway  Condition 2:  Firm Surface, EC 9 Sec, Severe Sway  Condition 3: Foam Surface, EO NT Sec, NT Sway  Condition 4: Foam Surface, EC NT Sec, NT Sway    Access Code: [email or web address] URL: [email or web address] Date: 03/24/2024 Prepared by: [facility] - [facility]  Exercises - Wide Stance with Head Nods and Head Turns  - 1-2 x daily - 7 x weekly - 1 sets - 5 reps    TREATMENT: 03/14/2024 Activity Comments  DGI 11/24  L loaded DH No overt nystagmus, latent very brief feeling of heavy/dizzy  R loaded DH Negative  Treated L  Epley Tolerates well, no dizziness upon sitting  L roll test Feels the dizziness, rotational upbeating nystagmus x 5 sec  L loaded DH L upbeating nystagmus x 5-10 sec  L Epley Tolerates well, dizziness noted in position 2 and position 3; no dizziness upon sitting  L loaded DH No symptoms, no nystagmus, proceeded through L Epley      PATIENT EDUCATION: Education details: update to HEP; rationale for exercises for multi-sensory balance to address vestibular system Person educated: Patient Education method: Explanation, Demonstration, and Verbal cues Education comprehension: verbalized understanding, returned demonstration, and needs further education  ------------------------------------------ Note: Objective measures were completed at Evaluation unless otherwise noted.  DIAGNOSTIC FINDINGS: none recent   COGNITION: Overall cognitive status: Within functional limits for tasks assessed   SENSATION: Pt reports hx of LE neuropathy   POSTURE:  rounded shoulders and forward head  GAIT: Gait pattern: unsteadiness, slowed gait Assistive device utilized: Single point cane Level of assistance: Modified independence and SBA  PATIENT SURVEYS:  DHI: 32/100  VESTIBULAR ASSESSMENT  *increased difficulty to complete oculomotor exam d/t visual impairment    GENERAL OBSERVATION: pt wears bifocals but does not use them while walking    OCULOMOTOR EXAM: Ocular alignment: WNL   Ocular ROM: No Limitations   Spontaneous Nystagmus: absent   Gaze-Induced Nystagmus: absent   Smooth Pursuits: 1-2 saccades to L   Saccades: slow   Convergence/Divergence: 5 cm    VESTIBULAR - OCULAR REFLEX:    Slow VOR: Normal; c/o some discomfort horizontal and vertical    VOR Cancellation: Corrective Saccades subtle to L   Head-Impulse Test: HIT Right: covert positive HIT Left: covert positive *HIT results were not consistent and could be d/t pt's limited visual acuity    AUDITORY SCREEN:  Rinne Test  WNL B    POSITIONAL TESTING:  Sit>supine: latent c/o dizziness and subtle few sec on L upbeating torsional nystagmus  Right Roll Test: negative  Left Roll Test: L upbeating torsional nystagmus lasting ~8 sec                                                                                                                                 TREATMENT DATE: 03/07/24  PATIENT EDUCATION: Education details: prognosis, POC, edu on exam findings and tx plan , answered pt's questions on upcoming  audiology appt Person educated: Patient Education method: Explanation Education comprehension: verbalized understanding  HOME EXERCISE PROGRAM:   GOALS: Goals reviewed with patient? Yes  SHORT TERM GOALS: Target date: 03/28/2024  Patient to be independent with initial HEP. Baseline: HEP initiated Goal status: INITIAL    LONG TERM GOALS: Target date: 04/18/2024  Patient to be independent with advanced HEP. Baseline: Not yet initiated  Goal status: INITIAL  Patient to report 0/10 dizziness with standing vertical and horizontal VOR for 30 seconds. Baseline: Unable Goal status: INITIAL  Patient will report 0/10 dizziness with bed mobility.  Baseline: Symptomatic  Goal status: INITIAL  Patient to score at least 20/24 on DGI in order to decrease risk of falls. Baseline: NT Goal status: INITIAL  Patient to report return to household chores without dizziness limiting.  Baseline: symptomatic  Goal status: INITIAL  Patient to score at least 18 points less on DHI in order to meet MCID and improve functional outcomes.  Baseline: 32 Goal status: INITIAL   ASSESSMENT:  CLINICAL IMPRESSION: Repeat of positional tests due to report of vertiginous episode this AM but unrevealing to vertigo or nystagmus.  Pt reports this issue has been recurrent through the years and inquires about self-assessment and treatment.  Instructed in R/L sidelying test to assess for positional vertigo and then performance  of Brandt-Daroff for subsequent treatment.  Demo good return understanding of rationale and return demonstration to movement and provided with written instruction as well as audiovisual instructions due to vision deficits   EVAL:  Patient is a 89 y/o F presenting to OPPT with c/o dizziness for the past several months. Of note, she reports sinusitis with recurrence 8 weeks ago. Notes that she had prior PT evaluation and treatment but no nystagmus was evident with positional testing but she still received Epley maneuver. Denies head trauma, vision changes/double vision, migraines. Reports some recent issues with hearing, chronic tinnitus, imbalance. Patient today presenting with Slowed saccades, a few saccades with smooth pursuit, symptomatic VOR, slight corrective saccades with L VOR cancellation, covert positive B HIT, and nystagmus with L roll test suggesting possible L canalithiasis. Plan to test further on next appointment. Abnormal oculomotor exam could be due to patients limited vision. Would benefit from skilled PT services 1-2 x/week for 6 weeks to address aforementioned impairments in order to optimize level of function.    OBJECTIVE IMPAIRMENTS: Abnormal gait, decreased activity tolerance, decreased balance, dizziness, and postural dysfunction.   ACTIVITY LIMITATIONS: carrying, lifting, bending, sitting, standing, squatting, sleeping, stairs, transfers, bed mobility, toileting, dressing, reach over head, hygiene/grooming, and locomotion level  PARTICIPATION LIMITATIONS: meal prep, cleaning, laundry, shopping, community activity, and church  PERSONAL FACTORS: Age, Past/current experiences, Time since onset of injury/illness/exacerbation, and 3+ comorbidities: Cervical spondylosis, depression, HLD, HTN, macular degeneration, vertigo, cervical spine surgery are also affecting patient's functional outcome.   REHAB POTENTIAL: Good  CLINICAL DECISION MAKING: Evolving/moderate  complexity  EVALUATION COMPLEXITY: Moderate   PLAN:  PT FREQUENCY: 1-2x/week  PT DURATION: 6 weeks  PLANNED INTERVENTIONS: 97164- PT Re-evaluation, 97110-Therapeutic exercises, 97530- Therapeutic activity, [id number]- Neuromuscular re-education, 97535- Self Care, 02859- Manual therapy, [id number]- Gait training, [phone]- Canalith repositioning, [phone] (1-2 muscles), [phone] (3+ muscles)- Dry Needling, Patient/Family education, Balance training, Stair training, Taping, Joint mobilization, Spinal mobilization, Vestibular training, Cryotherapy, and Moist heat  PLAN FOR NEXT SESSION: Retest positional vertigo as needed;  progress balance, vestibular/multi-sensory balance exercises and/or habituation.   12:48 PM, 03/26/24 M. Kelly Zaniya Mcaulay, PT, DPT Physical Therapist- [facility] Office Number: [phone]    "

## 2024-04-09 ENCOUNTER — Ambulatory Visit: Admitting: Physical Therapy

## 2024-04-09 ENCOUNTER — Encounter: Payer: Self-pay | Admitting: Physical Therapy

## 2024-04-09 DIAGNOSIS — R42 Dizziness and giddiness: Secondary | ICD-10-CM

## 2024-04-09 DIAGNOSIS — H8112 Benign paroxysmal vertigo, left ear: Secondary | ICD-10-CM

## 2024-04-09 DIAGNOSIS — R2681 Unsteadiness on feet: Secondary | ICD-10-CM

## 2024-04-10 ENCOUNTER — Encounter: Payer: Self-pay | Admitting: Physical Therapy

## 2024-04-10 ENCOUNTER — Ambulatory Visit: Admitting: Physical Therapy

## 2024-04-10 DIAGNOSIS — R42 Dizziness and giddiness: Secondary | ICD-10-CM

## 2024-04-10 DIAGNOSIS — R2681 Unsteadiness on feet: Secondary | ICD-10-CM

## 2024-04-10 DIAGNOSIS — H8112 Benign paroxysmal vertigo, left ear: Secondary | ICD-10-CM

## 2024-04-10 NOTE — Therapy (Signed)
 " OUTPATIENT PHYSICAL THERAPY VESTIBULAR TREATMENT NOTE     Patient Name: MARGURIETE WOOTAN MRN: [id number] DOB:1935/07/30, 89 y.o., female Today's Date: 04/10/2024  END OF SESSION:  PT End of Session - 04/10/24 1013     Visit Number 6    Number of Visits 13    Date for Recertification  04/18/24    Authorization Type BCBS Medicare    Progress Note Due on Visit 10    PT Start Time 0927    PT Stop Time 1012    PT Time Calculation (min) 45 min    Activity Tolerance Patient tolerated treatment well    Behavior During Therapy WFL for tasks assessed/performed              Past Medical History:  Diagnosis Date   Angiokeratoma    Cervical spondylosis without myelopathy    Depression    GERD (gastroesophageal reflux disease)    History of basal cell carcinoma    HLD (hyperlipidemia)    Hypertension    IBS (irritable bowel syndrome)    Lichen sclerosus of female genitalia    Macular degeneration    Nocturnal leg cramps    Vertigo    Vitamin B12 deficiency    Past Surgical History:  Procedure Laterality Date   APPENDECTOMY     CATARACT EXTRACTION Right    CERVICAL SPINE SURGERY     gallbladder resection     TONSILLECTOMY     Patient Active Problem List   Diagnosis Date Noted   Disturbance of skin sensation 04/02/2013   Pain in limb 04/02/2013    PCP: Chrystal Lamarr RAMAN, MD  REFERRING PROVIDER: Llewellyn Gerard LABOR, DO  REFERRING DIAG: R42 (ICD-10-CM) - Dizziness and giddiness  THERAPY DIAG:  BPPV (benign paroxysmal positional vertigo), left  Dizziness and giddiness  Unsteadiness on feet  ONSET DATE: several months  Rationale for Evaluation and Treatment: Rehabilitation  SUBJECTIVE:   SUBJECTIVE STATEMENT: Dizziness started right when I went to bed. I laid back, everything was fine. Then when I picked up a pillow it caused severe dizziness. I don't know why, but I know it had to do with the R side.      PERTINENT HISTORY: Cervical spondylosis,  depression, HLD, HTN, macular degeneration, vertigo, cervical spine surgery; pt reports significant visual impairment from macular degeneration   PAIN:  Are you having pain? No  PRECAUTIONS: Fall  RED FLAGS: None   WEIGHT BEARING RESTRICTIONS: No  FALLS: Has patient fallen in last 6 months? No  LIVING ENVIRONMENT: Lives with: alone in Independent Living Lives in: House/apartment Stairs: none Has following equipment at home: Single point cane, Environmental Consultant - 2 wheeled, and Environmental Consultant - 4 wheeled  PLOF: Independent, Vocation/Vocational requirements: retired, and Leisure: use the computer  PATIENT GOALS: improve dizziness  OBJECTIVE:     TODAY'S TREATMENT: 04/10/24 Activity Comments  L loaded DH Geotropic nystagmus lasting 45 sec, then apogeotropic nystagmus   R roll test Geotropic nystagmus (less intense)  Gufoni for L horizontal canalithiasis  Tolerated well  Sit>supine L beating nystagmus 5 sec  Roll test L geotropic nystagmus 60sec, then apogeo  Roll test R geotropic nystagmus persisting  BBQ roll for L  Totlerated well. Increase time required to resolve nystagmus in each position. Transitioned to L sidelying after BBQ roll where pt demonstrated apogeotropic nystagmus only, indicating resolution of L horizontal canalithiasis.               PATIENT EDUCATION: Education details: educated pt throughout  session on clinical reasoning behind tx; encouraged pt to have family member stay with her for safety d/t strong symptoms last night , edu on post-CRM expectations and positions to avoid tonight to avoid setting off sx  Person educated: Patient Education method: Explanation and Demonstration Education comprehension: verbalized understanding and returned demonstration     ------------------------------------------ Note: Objective measures were completed at Evaluation unless otherwise noted.  DIAGNOSTIC FINDINGS: none recent   COGNITION: Overall cognitive status: Within  functional limits for tasks assessed   SENSATION: Pt reports hx of LE neuropathy   POSTURE:  rounded shoulders and forward head  GAIT: Gait pattern: unsteadiness, slowed gait Assistive device utilized: Single point cane Level of assistance: Modified independence and SBA  PATIENT SURVEYS:  DHI: 32/100  VESTIBULAR ASSESSMENT  *increased difficulty to complete oculomotor exam d/t visual impairment    GENERAL OBSERVATION: pt wears bifocals but does not use them while walking    OCULOMOTOR EXAM: Ocular alignment: WNL   Ocular ROM: No Limitations   Spontaneous Nystagmus: absent   Gaze-Induced Nystagmus: absent   Smooth Pursuits: 1-2 saccades to L   Saccades: slow   Convergence/Divergence: 5 cm    VESTIBULAR - OCULAR REFLEX:    Slow VOR: Normal; c/o some discomfort horizontal and vertical    VOR Cancellation: Corrective Saccades subtle to L   Head-Impulse Test: HIT Right: covert positive HIT Left: covert positive *HIT results were not consistent and could be d/t pt's limited visual acuity    AUDITORY SCREEN:  Rinne Test WNL B    POSITIONAL TESTING:  Sit>supine: latent c/o dizziness and subtle few sec on L upbeating torsional nystagmus  Right Roll Test: negative  Left Roll Test: L upbeating torsional nystagmus lasting ~8 sec                                                                                                                                 TREATMENT DATE: 03/07/24  PATIENT EDUCATION: Education details: prognosis, POC, edu on exam findings and tx plan , answered pt's questions on upcoming audiology appt Person educated: Patient Education method: Explanation Education comprehension: verbalized understanding  HOME EXERCISE PROGRAM:   GOALS: Goals reviewed with patient? Yes  SHORT TERM GOALS: Target date: 03/28/2024  Patient to be independent with initial HEP. Baseline: HEP initiated Goal status: INITIAL    LONG TERM GOALS: Target date:  04/18/2024  Patient to be independent with advanced HEP. Baseline: Not yet initiated  Goal status: INITIAL  Patient to report 0/10 dizziness with standing vertical and horizontal VOR for 30 seconds. Baseline: Unable Goal status: INITIAL  Patient will report 0/10 dizziness with bed mobility.  Baseline: Symptomatic  Goal status: INITIAL  Patient to score at least 20/24 on DGI in order to decrease risk of falls. Baseline: NT Goal status: INITIAL  Patient to report return to household chores without dizziness limiting.  Baseline: symptomatic  Goal status: INITIAL  Patient to score at  least 18 points less on DHI in order to meet MCID and improve functional outcomes.  Baseline: 32 Goal status: INITIAL   ASSESSMENT:  CLINICAL IMPRESSION: Patient arrived to session with report of severe bout of dizziness last night while in bed. Retest of L DH indicated possible conversion to horizontal canal, with combination of canalithiasis and cupulolithiasis. Patient tolerated Gufoni x1 and BBQ x1 for L horizontal canalithiasis. L sidelying position after BBQ revealed apogeotropic nystagmus only, indicating resolution of L horizontal canalithiasis. Patient was educated on post-CRM safety and exited safely with a friend.    OBJECTIVE IMPAIRMENTS: Abnormal gait, decreased activity tolerance, decreased balance, dizziness, and postural dysfunction.   ACTIVITY LIMITATIONS: carrying, lifting, bending, sitting, standing, squatting, sleeping, stairs, transfers, bed mobility, toileting, dressing, reach over head, hygiene/grooming, and locomotion level  PARTICIPATION LIMITATIONS: meal prep, cleaning, laundry, shopping, community activity, and church  PERSONAL FACTORS: Age, Past/current experiences, Time since onset of injury/illness/exacerbation, and 3+ comorbidities: Cervical spondylosis, depression, HLD, HTN, macular degeneration, vertigo, cervical spine surgery are also affecting patient's functional  outcome.   REHAB POTENTIAL: Good  CLINICAL DECISION MAKING: Evolving/moderate complexity  EVALUATION COMPLEXITY: Moderate   PLAN:  PT FREQUENCY: 1-2x/week  PT DURATION: 6 weeks  PLANNED INTERVENTIONS: 97164- PT Re-evaluation, 97110-Therapeutic exercises, 97530- Therapeutic activity, 97112- Neuromuscular re-education, 97535- Self Care, 02859- Manual therapy, [id number]- Gait training, [phone]- Canalith repositioning, [phone] (1-2 muscles), 20561 (3+ muscles)- Dry Needling, Patient/Family education, Balance training, Stair training, Taping, Joint mobilization, Spinal mobilization, Vestibular training, Cryotherapy, and Moist heat  PLAN FOR NEXT SESSION: Recheck positional tests and treat ( had L horizontal canalithiasis and horizontal cupulo (unknown side) on 04/10/24);  progress balance, vestibular/multi-sensory balance exercises and/or habituation.   Louana Terrilyn Christians, PT, DPT 04/10/24 10:14 AM  [facility] at [facility] [address] [place], [address] [place], [place] [address] Phone # [phone] Fax # [phone]  . "

## 2024-04-11 ENCOUNTER — Telehealth: Payer: Self-pay | Admitting: Physical Therapy

## 2024-04-11 ENCOUNTER — Ambulatory Visit

## 2024-04-11 NOTE — Telephone Encounter (Signed)
 Took patient's phone call- she stated remaining dizziness this morning. As no same-day appointments are available today and patient is scheduled to be seen on 04/14/24, educated patient on safety measures to reduce falls risk at home until she is able to come in. Patient reported understanding and expressed appreciation.   Louana Terrilyn Christians, PT, DPT 04/11/24 9:19 AM  [facility] at [facility] [address] [place], [address] [place], [place] [address] Phone # [phone] Fax # [phone]

## 2024-04-11 NOTE — Therapy (Incomplete)
 " OUTPATIENT PHYSICAL THERAPY VESTIBULAR TREATMENT NOTE     Patient Name: Tiffany Mejia MRN: [id number] DOB:19-Oct-1935, 89 y.o., female Today's Date: 04/11/2024  END OF SESSION:        Past Medical History:  Diagnosis Date   Angiokeratoma    Cervical spondylosis without myelopathy    Depression    GERD (gastroesophageal reflux disease)    History of basal cell carcinoma    HLD (hyperlipidemia)    Hypertension    IBS (irritable bowel syndrome)    Lichen sclerosus of female genitalia    Macular degeneration    Nocturnal leg cramps    Vertigo    Vitamin B12 deficiency    Past Surgical History:  Procedure Laterality Date   APPENDECTOMY     CATARACT EXTRACTION Right    CERVICAL SPINE SURGERY     gallbladder resection     TONSILLECTOMY     Patient Active Problem List   Diagnosis Date Noted   Disturbance of skin sensation 04/02/2013   Pain in limb 04/02/2013    PCP: Chrystal Lamarr RAMAN, MD  REFERRING PROVIDER: Llewellyn Sayres A, DO  REFERRING DIAG: R42 (ICD-10-CM) - Dizziness and giddiness  THERAPY DIAG:  No diagnosis found.  ONSET DATE: several months  Rationale for Evaluation and Treatment: Rehabilitation  SUBJECTIVE:   SUBJECTIVE STATEMENT: Dizziness started right when I went to bed. I laid back, everything was fine. Then when I picked up a pillow it caused severe dizziness. I don't know why, but I know it had to do with the R side.      PERTINENT HISTORY: Cervical spondylosis, depression, HLD, HTN, macular degeneration, vertigo, cervical spine surgery; pt reports significant visual impairment from macular degeneration   PAIN:  Are you having pain? No  PRECAUTIONS: Fall  RED FLAGS: None   WEIGHT BEARING RESTRICTIONS: No  FALLS: Has patient fallen in last 6 months? No  LIVING ENVIRONMENT: Lives with: alone in Independent Living Lives in: House/apartment Stairs: none Has following equipment at home: Single point cane, Environmental Consultant - 2  wheeled, and Environmental Consultant - 4 wheeled  PLOF: Independent, Vocation/Vocational requirements: retired, and Leisure: use the computer  PATIENT GOALS: improve dizziness  OBJECTIVE:     TODAY'S TREATMENT: 04/14/24 Activity Comments                        TODAY'S TREATMENT: 04/10/24 Activity Comments  L loaded DH Geotropic nystagmus lasting 45 sec, then apogeotropic nystagmus   R roll test Geotropic nystagmus (less intense)  Gufoni for L horizontal canalithiasis  Tolerated well  Sit>supine L beating nystagmus 5 sec  Roll test L geotropic nystagmus 60sec, then apogeo  Roll test R geotropic nystagmus persisting  BBQ roll for L  Totlerated well. Increase time required to resolve nystagmus in each position. Transitioned to L sidelying after BBQ roll where pt demonstrated apogeotropic nystagmus only, indicating resolution of L horizontal canalithiasis.               PATIENT EDUCATION: Education details: educated pt throughout session on clinical reasoning behind tx; encouraged pt to have family member stay with her for safety d/t strong symptoms last night , edu on post-CRM expectations and positions to avoid tonight to avoid setting off sx  Person educated: Patient Education method: Medical Illustrator Education comprehension: verbalized understanding and returned demonstration     ------------------------------------------ Note: Objective measures were completed at Evaluation unless otherwise noted.  DIAGNOSTIC FINDINGS: none recent   COGNITION: Overall  cognitive status: Within functional limits for tasks assessed   SENSATION: Pt reports hx of LE neuropathy   POSTURE:  rounded shoulders and forward head  GAIT: Gait pattern: unsteadiness, slowed gait Assistive device utilized: Single point cane Level of assistance: Modified independence and SBA  PATIENT SURVEYS:  DHI: 32/100  VESTIBULAR ASSESSMENT  *increased difficulty to complete oculomotor exam d/t  visual impairment    GENERAL OBSERVATION: pt wears bifocals but does not use them while walking    OCULOMOTOR EXAM: Ocular alignment: WNL   Ocular ROM: No Limitations   Spontaneous Nystagmus: absent   Gaze-Induced Nystagmus: absent   Smooth Pursuits: 1-2 saccades to L   Saccades: slow   Convergence/Divergence: 5 cm    VESTIBULAR - OCULAR REFLEX:    Slow VOR: Normal; c/o some discomfort horizontal and vertical    VOR Cancellation: Corrective Saccades subtle to L   Head-Impulse Test: HIT Right: covert positive HIT Left: covert positive *HIT results were not consistent and could be d/t pt's limited visual acuity    AUDITORY SCREEN:  Rinne Test WNL B    POSITIONAL TESTING:  Sit>supine: latent c/o dizziness and subtle few sec on L upbeating torsional nystagmus  Right Roll Test: negative  Left Roll Test: L upbeating torsional nystagmus lasting ~8 sec                                                                                                                                 TREATMENT DATE: 03/07/24  PATIENT EDUCATION: Education details: prognosis, POC, edu on exam findings and tx plan , answered pt's questions on upcoming audiology appt Person educated: Patient Education method: Explanation Education comprehension: verbalized understanding  HOME EXERCISE PROGRAM:   GOALS: Goals reviewed with patient? Yes  SHORT TERM GOALS: Target date: 03/28/2024  Patient to be independent with initial HEP. Baseline: HEP initiated Goal status: INITIAL    LONG TERM GOALS: Target date: 04/18/2024  Patient to be independent with advanced HEP. Baseline: Not yet initiated  Goal status: INITIAL  Patient to report 0/10 dizziness with standing vertical and horizontal VOR for 30 seconds. Baseline: Unable Goal status: INITIAL  Patient will report 0/10 dizziness with bed mobility.  Baseline: Symptomatic  Goal status: INITIAL  Patient to score at least 20/24 on DGI in order to  decrease risk of falls. Baseline: NT Goal status: INITIAL  Patient to report return to household chores without dizziness limiting.  Baseline: symptomatic  Goal status: INITIAL  Patient to score at least 18 points less on DHI in order to meet MCID and improve functional outcomes.  Baseline: 32 Goal status: INITIAL   ASSESSMENT:  CLINICAL IMPRESSION: Patient arrived to session with report of severe bout of dizziness last night while in bed. Retest of L DH indicated possible conversion to horizontal canal, with combination of canalithiasis and cupulolithiasis. Patient tolerated Gufoni x1 and BBQ x1 for L horizontal canalithiasis. L sidelying position after BBQ revealed apogeotropic  nystagmus only, indicating resolution of L horizontal canalithiasis. Patient was educated on post-CRM safety and exited safely with a friend.    OBJECTIVE IMPAIRMENTS: Abnormal gait, decreased activity tolerance, decreased balance, dizziness, and postural dysfunction.   ACTIVITY LIMITATIONS: carrying, lifting, bending, sitting, standing, squatting, sleeping, stairs, transfers, bed mobility, toileting, dressing, reach over head, hygiene/grooming, and locomotion level  PARTICIPATION LIMITATIONS: meal prep, cleaning, laundry, shopping, community activity, and church  PERSONAL FACTORS: Age, Past/current experiences, Time since onset of injury/illness/exacerbation, and 3+ comorbidities: Cervical spondylosis, depression, HLD, HTN, macular degeneration, vertigo, cervical spine surgery are also affecting patient's functional outcome.   REHAB POTENTIAL: Good  CLINICAL DECISION MAKING: Evolving/moderate complexity  EVALUATION COMPLEXITY: Moderate   PLAN:  PT FREQUENCY: 1-2x/week  PT DURATION: 6 weeks  PLANNED INTERVENTIONS: 97164- PT Re-evaluation, 97110-Therapeutic exercises, 97530- Therapeutic activity, 97112- Neuromuscular re-education, 97535- Self Care, [id number]- Manual therapy, [id number]- Gait training, [phone]-  Canalith repositioning, [phone] (1-2 muscles), [id number] (3+ muscles)- Dry Needling, Patient/Family education, Balance training, Stair training, Taping, Joint mobilization, Spinal mobilization, Vestibular training, Cryotherapy, and Moist heat  PLAN FOR NEXT SESSION: Recheck positional tests and treat ( had L horizontal canalithiasis and horizontal cupulo (unknown side) on 04/10/24);  progress balance, vestibular/multi-sensory balance exercises and/or habituation.   Louana Terrilyn Christians, PT, DPT 04/11/24 9:09 AM  [facility] [address] [place], [address] [place], [place] [address] Phone # [phone] Fax # [phone]  . "

## 2024-04-14 ENCOUNTER — Ambulatory Visit: Admitting: Physical Therapy

## 2024-04-18 ENCOUNTER — Ambulatory Visit: Admitting: Physical Therapy
